# Patient Record
Sex: Female | Born: 1990 | Race: White | Hispanic: No | Marital: Single | Smoking: Current every day smoker
Health system: Southern US, Community
[De-identification: ages and names within clinical notes are randomized; demographics above are authoritative.]

## PROBLEM LIST (undated history)

## (undated) ENCOUNTER — Inpatient Hospital Stay (HOSPITAL_COMMUNITY): Payer: Self-pay

## (undated) DIAGNOSIS — A749 Chlamydial infection, unspecified: Secondary | ICD-10-CM

## (undated) DIAGNOSIS — N390 Urinary tract infection, site not specified: Secondary | ICD-10-CM

## (undated) DIAGNOSIS — Z8619 Personal history of other infectious and parasitic diseases: Secondary | ICD-10-CM

## (undated) DIAGNOSIS — Z8742 Personal history of other diseases of the female genital tract: Secondary | ICD-10-CM

## (undated) DIAGNOSIS — N83209 Unspecified ovarian cyst, unspecified side: Secondary | ICD-10-CM

## (undated) DIAGNOSIS — K824 Cholesterolosis of gallbladder: Secondary | ICD-10-CM

## (undated) HISTORY — PX: UPPER GI ENDOSCOPY: SHX6162

## (undated) HISTORY — DX: Personal history of other diseases of the female genital tract: Z87.42

## (undated) HISTORY — DX: Cholesterolosis of gallbladder: K82.4

## (undated) HISTORY — DX: Personal history of other infectious and parasitic diseases: Z86.19

## (undated) HISTORY — PX: WISDOM TOOTH EXTRACTION: SHX21

---
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
## 2010-03-23 ENCOUNTER — Ambulatory Visit (HOSPITAL_COMMUNITY): Admission: RE | Admit: 2010-03-23 | Discharge: 2010-03-23 | Payer: Self-pay | Admitting: Obstetrics and Gynecology

## 2010-12-25 ENCOUNTER — Emergency Department (HOSPITAL_COMMUNITY)
Admission: EM | Admit: 2010-12-25 | Discharge: 2010-12-25 | Payer: Self-pay | Source: Home / Self Care | Admitting: Emergency Medicine

## 2011-04-11 NOTE — Discharge Summary (Signed)
NAMEASLYN, COTTMAN NO.:  [id number]   MEDICAL RECORD NO.:  [id number]          PATIENT TYPE:  OBV   LOCATION:  6127                         FACILITY:  [facility]   PHYSICIAN:  Dyann Ruddle, MDDATE OF BIRTH:  February 21, 1991   DATE OF ADMISSION:  12/31/2007  DATE OF DISCHARGE:  01/01/2008                               DISCHARGE SUMMARY   REASON FOR HOSPITALIZATION:  A 20 year old female with bilateral  tuboovarian abscesses.   SIGNIFICANT FINDINGS:  A 20 year old female was admitted from her  primary care physician at [facility] for bilateral tuboovarian  abscess diagnosed on a pelvic ultrasound (transvaginal and abdominal).  She received ceftriaxone at her PMD.  At the PMD's office, urine GC and  chlamydia were both positive.  Initial bimanual exam demonstrated right  adnexal tenderness.  Ultrasound confirmed bilateral fluid collections in  the fallopian tubes.  Initial CBC demonstrated white count of 15.5 with  hemoglobin 13.5, hematocrit 40.3, platelets 356, 85% neutrophils.  During her hospitalization, she was able to tolerate p.o. medications  with minimal abdominal pain.  She was also afebrile during the admission  (had been afebrile throughout her illness).  She was discussed with Dr.  Ellyn Hack at [facility] who will follow her as an outpatient.  Dr.  Ellyn Hack agreed that she could be discharged on p.o. medications and will  follow up in 1 week in their clinic.  The team discussed the importance  of completing all medication doses completely with both the patient and  her mother who was aware of her condition.  We also discussed side  effects of the medication and the need to avoid alcohol as well as to  have her partner treated.  She has already told her partner about her  diagnosis and has recommended that he receive treatment.  Mother is also  understanding the importance of completion of the entire 14 day course  of antibiotics and will  help to assure that she completes these.   TREATMENT:  Doxycycline p.o. and Flagyl.   FINAL DIAGNOSIS:  Pelvic inflammatory disease with bilateral tuboovarian  abscess secondary to gonorrhea and chlamydia.   DISCHARGE MEDICATIONS:  1. Doxycycline 100 mg p.o. b.i.d. x 13 days.  2. Flagyl 500 mg p.o. b.i.d. x 13 days.   DISCHARGE INSTRUCTIONS:  1. She is to call or see an MD if she has worsening abdominal pain,      fevers or other concerns.  2. Pending results and issues to be followed include HIV, RPR, serum      beta hCG which were drawn prior to discharge.   FOLLOW UP:  She will follow up with Dr. Ellyn Hack at [facility] on  January 10, 2008 at 11 a.m.  (Phone [phone]).   Discharge weight is 71.4 kilos.   CONDITION ON DISCHARGE:  Improved.   This discharge summary is faxed to her primary care physician at  [facility] as well as to [facility].    Addendum:  At time of signing, HIV, RPR and beta hCG are all negative. --lsp      Pediatrics Resident  Dyann Ruddle, MD  Electronically Signed    PR/MEDQ  D:  01/01/2008  T:  01/02/2008  Job:  [id number]   cc:   Jay Schlichter, MD  Sherron Monday, MD

## 2011-08-18 LAB — DIFFERENTIAL
Basophils Absolute: 0.1
Eosinophils Absolute: 0
Eosinophils Relative: 0
Lymphocytes Relative: 10 — ABNORMAL LOW
Monocytes Absolute: 0.5
Neutrophils Relative %: 85 — ABNORMAL HIGH

## 2011-08-18 LAB — CBC
Hemoglobin: 13.5
Platelets: 356
RDW: 12.3
WBC: 15.5 — ABNORMAL HIGH

## 2011-08-18 LAB — HCG, QUANTITATIVE, PREGNANCY: hCG, Beta Chain, Quant, S: <2

## 2011-11-28 NOTE — L&D Delivery Note (Signed)
Delivery Note Pt progressed to complete and pushed well.  At 3:36 PM a viable female was delivered via Vaginal, Spontaneous Delivery (Presentation: Right Occiput Anterior).  APGAR: 8, 9; weight pending.   Placenta status: Intact, Spontaneous.  Cord: 3 vessels with the following complications: None.  Anesthesia: Epidural  Episiotomy: None Lacerations: Vaginal Suture Repair: 3.0 vicryl rapide Est. Blood Loss (mL): 400  Mom to postpartum.  Baby to stay with mom.  Bruno Leach D 10/28/2012, 3:52 PM

## 2012-05-31 ENCOUNTER — Inpatient Hospital Stay (HOSPITAL_COMMUNITY)
Admission: AD | Admit: 2012-05-31 | Discharge: 2012-05-31 | Disposition: A | Discharge status: Home / Self Care | Payer: Medicaid Other | Source: Ambulatory Visit | Attending: Obstetrics & Gynecology | Admitting: Obstetrics & Gynecology

## 2012-05-31 ENCOUNTER — Encounter (HOSPITAL_COMMUNITY): Payer: Self-pay | Admitting: *Deleted

## 2012-05-31 DIAGNOSIS — N949 Unspecified condition associated with female genital organs and menstrual cycle: Secondary | ICD-10-CM

## 2012-05-31 DIAGNOSIS — R42 Dizziness and giddiness: Secondary | ICD-10-CM

## 2012-05-31 DIAGNOSIS — O2650 Maternal hypotension syndrome, unspecified trimester: Secondary | ICD-10-CM | POA: Insufficient documentation

## 2012-05-31 DIAGNOSIS — R1032 Left lower quadrant pain: Secondary | ICD-10-CM | POA: Insufficient documentation

## 2012-05-31 LAB — URINE MICROSCOPIC-ADD ON

## 2012-05-31 LAB — URINALYSIS, ROUTINE W REFLEX MICROSCOPIC
Bilirubin Urine: NEGATIVE
Ketones, ur: 40 mg/dL — AB
Specific Gravity, Urine: 1.025 (ref 1.005–1.030)
Urobilinogen, UA: 0.2 mg/dL (ref 0.0–1.0)

## 2012-05-31 NOTE — MAU Provider Note (Signed)
  History     CSN: [id number]  Arrival date and time: 05/31/12 1836   None     Chief Complaint  Patient presents with  . Abdominal Pain   HPI This is a 21 year old G2 P0 010 at 18 weeks and 3 days presents the MAU with left lower quadrant abdominal pain that started 2 weeks ago and is worse when walking and moving. She denies vaginal bleeding, contractions, vaginal discharge. She also has occasional episodes of presyncope and feeling lightheaded and dizzy. These usually happened when she is standing for long periods of time. She denies chest pain, palpitations, shortness of breath.  OB History    Grav Para Term Preterm Abortions TAB SAB Ect Mult Living   2    1  1          Past Medical History  Diagnosis Date  . No pertinent past medical history     Past Surgical History  Procedure Date  . No past surgeries     Family History  Problem Relation Age of Onset  . Other Neg Hx     History  Substance Use Topics  . Smoking status: Current Everyday Smoker -- 0.2 packs/day    Types: Cigarettes  . Smokeless tobacco: Not on file  . Alcohol Use: No    Allergies: No Known Allergies  Prescriptions prior to admission  Medication Sig Dispense Refill  . Prenatal Vit-Fe Fumarate-FA (PRENATAL MULTIVITAMIN) TABS Take 1 tablet by mouth daily.        Review of Systems  All other systems reviewed and are negative.   Physical Exam   Blood pressure 114/63, pulse 82, temperature 98.9 F (37.2 C), temperature source Oral, resp. rate 20, height 5\' 9"  (1.753 m), weight 74.299 kg (163 lb 12.8 oz), last menstrual period 01/23/2012.  Physical Exam  Constitutional: She appears well-developed.  HENT:  Head: Normocephalic and atraumatic.  Cardiovascular: Normal rate and regular rhythm.   Respiratory: Effort normal.  GI: Soft. She exhibits no distension and no mass. There is tenderness (Mild left lower quadrant tenderness). There is no rebound and no guarding.       Fundal height  approximates dates.  Skin: Skin is warm and dry. No rash noted. No erythema. No pallor.  Psychiatric: She has a normal mood and affect. Her behavior is normal. Judgment and thought content normal.    MAU Course  Procedures   Assessment and Plan  #1 presyncope #2 round ligament pain #3 intrauterine pregnancy at 18 weeks and 3 days  Surgical patient to remain adequately hydrated and to avoid excessive periods of time in hot and humid conditions. Reassured patient regarding round ligament pain. The patient to obtain prenatal care with [facility]. Patient to followup with conditions worse.  Crystal Owens JEHIEL 05/31/2012, 8:59 PM

## 2012-05-31 NOTE — MAU Note (Signed)
I've been having sharp, cramping pains in lower abdomin for 3 days. At first the pain would go away but now it's not going away.

## 2012-06-25 LAB — OB RESULTS CONSOLE HEPATITIS B SURFACE ANTIGEN: Hepatitis B Surface Ag: NEGATIVE

## 2012-06-25 LAB — OB RESULTS CONSOLE RPR: RPR: NONREACTIVE

## 2012-06-25 LAB — OB RESULTS CONSOLE ABO/RH: RH Type: POSITIVE

## 2012-06-25 LAB — OB RESULTS CONSOLE ANTIBODY SCREEN: Antibody Screen: NEGATIVE

## 2012-06-25 LAB — OB RESULTS CONSOLE HIV ANTIBODY (ROUTINE TESTING): HIV: NONREACTIVE

## 2012-06-25 LAB — OB RESULTS CONSOLE GC/CHLAMYDIA: Gonorrhea: NEGATIVE

## 2012-08-08 ENCOUNTER — Encounter (HOSPITAL_COMMUNITY): Payer: Self-pay | Admitting: *Deleted

## 2012-08-08 ENCOUNTER — Encounter: Payer: Self-pay | Admitting: Advanced Practice Midwife

## 2012-08-08 ENCOUNTER — Inpatient Hospital Stay (HOSPITAL_COMMUNITY)
Admission: AD | Admit: 2012-08-08 | Discharge: 2012-08-08 | Disposition: A | Discharge status: Home / Self Care | Payer: Medicaid Other | Source: Ambulatory Visit | Attending: Obstetrics and Gynecology | Admitting: Obstetrics and Gynecology

## 2012-08-08 DIAGNOSIS — R109 Unspecified abdominal pain: Secondary | ICD-10-CM | POA: Insufficient documentation

## 2012-08-08 DIAGNOSIS — N390 Urinary tract infection, site not specified: Secondary | ICD-10-CM

## 2012-08-08 DIAGNOSIS — N12 Tubulo-interstitial nephritis, not specified as acute or chronic: Secondary | ICD-10-CM | POA: Insufficient documentation

## 2012-08-08 DIAGNOSIS — O2390 Unspecified genitourinary tract infection in pregnancy, unspecified trimester: Secondary | ICD-10-CM | POA: Insufficient documentation

## 2012-08-08 DIAGNOSIS — O2300 Infections of kidney in pregnancy, unspecified trimester: Secondary | ICD-10-CM

## 2012-08-08 DIAGNOSIS — M549 Dorsalgia, unspecified: Secondary | ICD-10-CM | POA: Insufficient documentation

## 2012-08-08 HISTORY — DX: Chlamydial infection, unspecified: A74.9

## 2012-08-08 HISTORY — DX: Unspecified ovarian cyst, unspecified side: N83.209

## 2012-08-08 HISTORY — DX: Urinary tract infection, site not specified: N39.0

## 2012-08-08 LAB — URINALYSIS, ROUTINE W REFLEX MICROSCOPIC
Nitrite: NEGATIVE
Specific Gravity, Urine: 1.01 (ref 1.005–1.030)
Urobilinogen, UA: 0.2 mg/dL (ref 0.0–1.0)

## 2012-08-08 LAB — URINE MICROSCOPIC-ADD ON

## 2012-08-08 MED ORDER — DEXTROSE 5 % IV SOLN
2.0000 g | Freq: Once | INTRAVENOUS | Status: AC
  Administered 2012-08-08: 2 g via INTRAVENOUS
  Filled 2012-08-08: qty 2

## 2012-08-08 MED ORDER — SODIUM CHLORIDE 0.9 % IV SOLN
INTRAVENOUS | Status: DC
  Administered 2012-08-08: 18:00:00 via INTRAVENOUS

## 2012-08-08 MED ORDER — SULFAMETHOXAZOLE-TRIMETHOPRIM 800-160 MG PO TABS: 1.0000 | ORAL_TABLET | Freq: Two times a day (BID) | ORAL | Status: AC

## 2012-08-08 NOTE — MAU Note (Signed)
Real bad pain in mid back, started Sun night.  Continues and has gotten worse.

## 2012-08-08 NOTE — MAU Provider Note (Signed)
History     CSN: [id number]  Arrival date and time: 08/08/12 1541   First Provider Initiated Contact with Patient 08/08/12 1653      Chief Complaint  Patient presents with  . Back Pain   HPI Crystal Owens is 21 y.o. G2P0010 [redacted]w[redacted]d weeks presenting with back ache X 4 days.   Patient of Dr. Emeline Darling, last seen in office 9/3.   States she has chronic back pain.  The pain has been intermittent but usually the whole back but now it is only the right back.  Denies UTI sxs. Denies fever and chills.   Last intercourse last week.      Past Medical History  Diagnosis Date  . No pertinent past medical history   . Urinary tract infection   . Ovarian cyst   . Gonorrhea   . Chlamydia     Past Surgical History  Procedure Date  . No past surgeries     Family History  Problem Relation Age of Onset  . Other Neg Hx   . Hearing loss Neg Hx   . Hypertension Mother   . Cancer Mother 48    lung  . Cancer Maternal Uncle     lung  . Heart disease Maternal Grandfather   . Heart disease Paternal Grandfather     grandfather, great grandfather    History  Substance Use Topics  . Smoking status: Current Every Day Smoker -- 0.2 packs/day for 7 years    Types: Cigarettes  . Smokeless tobacco: Never Used  . Alcohol Use: No    Allergies: No Known Allergies  Prescriptions prior to admission  Medication Sig Dispense Refill  . Prenatal Vit-Fe Fumarate-FA (PRENATAL MULTIVITAMIN) TABS Take 1 tablet by mouth daily.        Review of Systems  Constitutional: Negative for fever and chills.  Genitourinary: Positive for flank pain. Negative for dysuria, urgency, frequency and hematuria.       Neg for vaginal bleeding  Musculoskeletal: Positive for back pain.  Neurological: Negative for weakness.   Physical Exam   Blood pressure 112/68, pulse 104, temperature 99.2 F (37.3 C), temperature source Oral, resp. rate 20, height 5' 9.5" (1.765 m), weight 79.652 kg (175 lb 9.6 oz), last menstrual  period 01/23/2012.  Physical Exam  Vitals reviewed. Constitutional: She is oriented to person, place, and time. She appears well-developed and well-nourished. No distress.       uncomfortable  Respiratory: Effort normal.  GI: Soft. There is no tenderness. There is CVA tenderness (right).  Neurological: She is alert and oriented to person, place, and time.  Skin: Skin is warm and dry.  Psychiatric: She has a normal mood and affect. Her behavior is normal.   Results for orders placed during the hospital encounter of 08/08/12 (from the past 24 hour(s))  URINALYSIS, ROUTINE W REFLEX MICROSCOPIC     Status: Abnormal   Collection Time   08/08/12  4:05 PM      Component Value Range   Color, Urine YELLOW  YELLOW   APPearance CLEAR  CLEAR   Specific Gravity, Urine 1.010  1.005 - 1.030   pH 6.5  5.0 - 8.0   Glucose, UA NEGATIVE  NEGATIVE mg/dL   Hgb urine dipstick MODERATE (*) NEGATIVE   Bilirubin Urine NEGATIVE  NEGATIVE   Ketones, ur NEGATIVE  NEGATIVE mg/dL   Protein, ur 30 (*) NEGATIVE mg/dL   Urobilinogen, UA 0.2  0.0 - 1.0 mg/dL   Nitrite NEGATIVE  NEGATIVE  Leukocytes, UA MODERATE (*) NEGATIVE  URINE MICROSCOPIC-ADD ON     Status: Abnormal   Collection Time   08/08/12  4:05 PM      Component Value Range   Squamous Epithelial / LPF RARE  RARE   WBC, UA 21-50  <3 WBC/hpf   RBC / HPF 11-20  <3 RBC/hpf   Bacteria, UA MANY (*) RARE   Urine-Other MUCOUS PRESENT     MAU Course  Procedures  MDM Urine culture sent to lab 17:10  Reported MSE to Dr. Senaida Ores.  Order given for Rocephin 2 gm and home with Rx for Bactrim DS 1 tab bid X 1 week.  Keep appt for 9/18.   18:10  Rocephin 2gm IV infused.  Patient ready for discharge.  FMS--FHR 150s, mod variability, 10X10s at time of discharge  Assessment and Plan  A:  Right flank pain at [redacted]w[redacted]d gestation       Presumptive right pyelonephritis  P  Rocephin 2 gms IV given in MAU     Rx for Bactrim DS 1 po BID X 1 week     Keep scheduled  appointment for 08/14/12     Instructions to call her doctor of sxs worsen, fever or chills  KEY,EVE M 08/08/2012, 4:58 PM

## 2012-08-08 NOTE — MAU Note (Signed)
Rt CVA tenderness. 

## 2012-08-10 LAB — URINE CULTURE: Colony Count: 30000

## 2012-09-03 LAB — OB RESULTS CONSOLE GC/CHLAMYDIA: Chlamydia: NEGATIVE

## 2012-10-22 ENCOUNTER — Encounter (HOSPITAL_COMMUNITY): Payer: Self-pay | Admitting: *Deleted

## 2012-10-22 ENCOUNTER — Telehealth (HOSPITAL_COMMUNITY): Payer: Self-pay | Admitting: *Deleted

## 2012-10-22 NOTE — Telephone Encounter (Signed)
Preadmission screen  

## 2012-10-28 ENCOUNTER — Encounter (HOSPITAL_COMMUNITY): Payer: Self-pay | Admitting: Anesthesiology

## 2012-10-28 ENCOUNTER — Inpatient Hospital Stay (HOSPITAL_COMMUNITY)
Admission: AD | Admit: 2012-10-28 | Discharge: 2012-10-30 | DRG: 775 | Disposition: A | Discharge status: Home / Self Care | Payer: Medicaid Other | Source: Ambulatory Visit | Attending: Obstetrics and Gynecology | Admitting: Obstetrics and Gynecology

## 2012-10-28 ENCOUNTER — Encounter (HOSPITAL_COMMUNITY): Payer: Self-pay | Admitting: *Deleted

## 2012-10-28 ENCOUNTER — Inpatient Hospital Stay (HOSPITAL_COMMUNITY): Payer: Medicaid Other | Admitting: Anesthesiology

## 2012-10-28 DIAGNOSIS — O99892 Other specified diseases and conditions complicating childbirth: Secondary | ICD-10-CM | POA: Diagnosis present

## 2012-10-28 DIAGNOSIS — Z22330 Carrier of Group B streptococcus: Secondary | ICD-10-CM

## 2012-10-28 LAB — CBC
HCT: 30.8 % — ABNORMAL LOW (ref 36.0–46.0)
MCHC: 34.7 g/dL (ref 30.0–36.0)
Platelets: 146 10*3/uL — ABNORMAL LOW (ref 150–400)
RDW: 13.4 % (ref 11.5–15.5)
WBC: 12.3 10*3/uL — ABNORMAL HIGH (ref 4.0–10.5)

## 2012-10-28 LAB — RPR: RPR Ser Ql: NONREACTIVE

## 2012-10-28 MED ORDER — ONDANSETRON HCL 4 MG PO TABS: 4.0000 mg | ORAL_TABLET | ORAL | Status: DC | PRN

## 2012-10-28 MED ORDER — ONDANSETRON HCL 4 MG/2ML IJ SOLN: 4.0000 mg | INTRAMUSCULAR | Status: DC | PRN

## 2012-10-28 MED ORDER — WITCH HAZEL-GLYCERIN EX PADS: 1.0000 "application " | MEDICATED_PAD | CUTANEOUS | Status: DC | PRN

## 2012-10-28 MED ORDER — SODIUM BICARBONATE 8.4 % IV SOLN
INTRAVENOUS | Status: DC | PRN
  Administered 2012-10-28: 5 mL via EPIDURAL

## 2012-10-28 MED ORDER — DIPHENHYDRAMINE HCL 50 MG/ML IJ SOLN: 12.5000 mg | INTRAMUSCULAR | Status: DC | PRN

## 2012-10-28 MED ORDER — OXYCODONE-ACETAMINOPHEN 5-325 MG PO TABS
1.0000 | ORAL_TABLET | ORAL | Status: DC | PRN
  Administered 2012-10-28 – 2012-10-30 (×5): 1 via ORAL
  Filled 2012-10-28 (×4): qty 1

## 2012-10-28 MED ORDER — ACETAMINOPHEN 325 MG PO TABS: 650.0000 mg | ORAL_TABLET | ORAL | Status: DC | PRN

## 2012-10-28 MED ORDER — IBUPROFEN 600 MG PO TABS: 600.0000 mg | ORAL_TABLET | Freq: Four times a day (QID) | ORAL | Status: DC | PRN

## 2012-10-28 MED ORDER — PRENATAL MULTIVITAMIN CH
1.0000 | ORAL_TABLET | Freq: Every day | ORAL | Status: DC
  Administered 2012-10-29 – 2012-10-30 (×2): 1 via ORAL
  Filled 2012-10-28 (×2): qty 1

## 2012-10-28 MED ORDER — ONDANSETRON HCL 4 MG/2ML IJ SOLN: 4.0000 mg | Freq: Four times a day (QID) | INTRAMUSCULAR | Status: DC | PRN

## 2012-10-28 MED ORDER — LACTATED RINGERS IV SOLN: 500.0000 mL | INTRAVENOUS | Status: DC | PRN

## 2012-10-28 MED ORDER — FENTANYL 2.5 MCG/ML BUPIVACAINE 1/10 % EPIDURAL INFUSION (WH - ANES)
14.0000 mL/h | INTRAMUSCULAR | Status: DC
  Filled 2012-10-28: qty 125

## 2012-10-28 MED ORDER — METHYLERGONOVINE MALEATE 0.2 MG/ML IJ SOLN: 0.2000 mg | INTRAMUSCULAR | Status: DC | PRN

## 2012-10-28 MED ORDER — DIPHENHYDRAMINE HCL 25 MG PO CAPS: 25.0000 mg | ORAL_CAPSULE | Freq: Four times a day (QID) | ORAL | Status: DC | PRN

## 2012-10-28 MED ORDER — EPHEDRINE 5 MG/ML INJ: 10.0000 mg | INTRAVENOUS | Status: DC | PRN

## 2012-10-28 MED ORDER — OXYCODONE-ACETAMINOPHEN 5-325 MG PO TABS: 1.0000 | ORAL_TABLET | ORAL | Status: DC | PRN

## 2012-10-28 MED ORDER — BENZOCAINE-MENTHOL 20-0.5 % EX AERO: 1.0000 "application " | INHALATION_SPRAY | CUTANEOUS | Status: DC | PRN

## 2012-10-28 MED ORDER — LACTATED RINGERS IV SOLN: 500.0000 mL | Freq: Once | INTRAVENOUS | Status: DC

## 2012-10-28 MED ORDER — FENTANYL 2.5 MCG/ML BUPIVACAINE 1/10 % EPIDURAL INFUSION (WH - ANES)
INTRAMUSCULAR | Status: DC | PRN
  Administered 2012-10-28: 14 mL/h via EPIDURAL

## 2012-10-28 MED ORDER — LANOLIN HYDROUS EX OINT: TOPICAL_OINTMENT | CUTANEOUS | Status: DC | PRN

## 2012-10-28 MED ORDER — DIBUCAINE 1 % RE OINT: 1.0000 "application " | TOPICAL_OINTMENT | RECTAL | Status: DC | PRN

## 2012-10-28 MED ORDER — PENICILLIN G POTASSIUM 5000000 UNITS IJ SOLR
2.5000 10*6.[IU] | INTRAVENOUS | Status: DC
  Administered 2012-10-28: 2.5 10*6.[IU] via INTRAVENOUS
  Filled 2012-10-28 (×5): qty 2.5

## 2012-10-28 MED ORDER — SIMETHICONE 80 MG PO CHEW: 80.0000 mg | CHEWABLE_TABLET | ORAL | Status: DC | PRN

## 2012-10-28 MED ORDER — PENICILLIN G POTASSIUM 5000000 UNITS IJ SOLR
5.0000 10*6.[IU] | Freq: Once | INTRAVENOUS | Status: AC
  Administered 2012-10-28: 5 10*6.[IU] via INTRAVENOUS
  Filled 2012-10-28: qty 5

## 2012-10-28 MED ORDER — OXYTOCIN 40 UNITS IN LACTATED RINGERS INFUSION - SIMPLE MED
1.0000 m[IU]/min | INTRAVENOUS | Status: DC
  Administered 2012-10-28: 2 m[IU]/min via INTRAVENOUS
  Filled 2012-10-28: qty 1000

## 2012-10-28 MED ORDER — OXYTOCIN BOLUS FROM INFUSION: 500.0000 mL | INTRAVENOUS | Status: DC

## 2012-10-28 MED ORDER — CITRIC ACID-SODIUM CITRATE 334-500 MG/5ML PO SOLN: 30.0000 mL | ORAL | Status: DC | PRN

## 2012-10-28 MED ORDER — MAGNESIUM HYDROXIDE 400 MG/5ML PO SUSP: 30.0000 mL | ORAL | Status: DC | PRN

## 2012-10-28 MED ORDER — IBUPROFEN 600 MG PO TABS
600.0000 mg | ORAL_TABLET | Freq: Four times a day (QID) | ORAL | Status: DC
  Administered 2012-10-28 – 2012-10-30 (×8): 600 mg via ORAL
  Filled 2012-10-28 (×8): qty 1

## 2012-10-28 MED ORDER — OXYTOCIN 40 UNITS IN LACTATED RINGERS INFUSION - SIMPLE MED: 62.5000 mL/h | INTRAVENOUS | Status: DC

## 2012-10-28 MED ORDER — LACTATED RINGERS IV SOLN
INTRAVENOUS | Status: DC
  Administered 2012-10-28 (×2): via INTRAVENOUS

## 2012-10-28 MED ORDER — MEASLES, MUMPS & RUBELLA VAC ~~LOC~~ INJ
0.5000 mL | INJECTION | Freq: Once | SUBCUTANEOUS | Status: AC
  Administered 2012-10-30: 0.5 mL via SUBCUTANEOUS
  Filled 2012-10-28: qty 0.5

## 2012-10-28 MED ORDER — BUTORPHANOL TARTRATE 1 MG/ML IJ SOLN
2.0000 mg | Freq: Once | INTRAMUSCULAR | Status: AC
  Administered 2012-10-28: 2 mg via INTRAVENOUS
  Filled 2012-10-28 (×2): qty 1

## 2012-10-28 MED ORDER — TETANUS-DIPHTH-ACELL PERTUSSIS 5-2.5-18.5 LF-MCG/0.5 IM SUSP: 0.5000 mL | Freq: Once | INTRAMUSCULAR | Status: DC

## 2012-10-28 MED ORDER — EPHEDRINE 5 MG/ML INJ
10.0000 mg | INTRAVENOUS | Status: DC | PRN
  Filled 2012-10-28: qty 4

## 2012-10-28 MED ORDER — LIDOCAINE HCL (PF) 1 % IJ SOLN: 30.0000 mL | INTRAMUSCULAR | Status: DC | PRN

## 2012-10-28 MED ORDER — PHENYLEPHRINE 40 MCG/ML (10ML) SYRINGE FOR IV PUSH (FOR BLOOD PRESSURE SUPPORT)
80.0000 ug | PREFILLED_SYRINGE | INTRAVENOUS | Status: DC | PRN
  Filled 2012-10-28: qty 5

## 2012-10-28 MED ORDER — SENNOSIDES-DOCUSATE SODIUM 8.6-50 MG PO TABS
2.0000 | ORAL_TABLET | Freq: Every day | ORAL | Status: DC
  Administered 2012-10-28: 2 via ORAL

## 2012-10-28 MED ORDER — METHYLERGONOVINE MALEATE 0.2 MG PO TABS: 0.2000 mg | ORAL_TABLET | ORAL | Status: DC | PRN

## 2012-10-28 MED ORDER — PHENYLEPHRINE 40 MCG/ML (10ML) SYRINGE FOR IV PUSH (FOR BLOOD PRESSURE SUPPORT): 80.0000 ug | PREFILLED_SYRINGE | INTRAVENOUS | Status: DC | PRN

## 2012-10-28 MED ORDER — TERBUTALINE SULFATE 1 MG/ML IJ SOLN: 0.2500 mg | Freq: Once | INTRAMUSCULAR | Status: DC | PRN

## 2012-10-28 MED ORDER — LACTATED RINGERS IV BOLUS (SEPSIS)
1000.0000 mL | Freq: Once | INTRAVENOUS | Status: AC
  Administered 2012-10-28: 1000 mL via INTRAVENOUS

## 2012-10-28 MED ORDER — ZOLPIDEM TARTRATE 5 MG PO TABS: 5.0000 mg | ORAL_TABLET | Freq: Every evening | ORAL | Status: DC | PRN

## 2012-10-28 NOTE — H&P (Signed)
Crystal Owens is a 21 y.o. female G2P0010 at 70+ with painful contractions and bloody show.  Pt states contractions every 5 minutes since 1 am.  Cervical change from office 1cm to 2.5cm.  Also with occasional lates in MAU. Prenatal care uncomplicated except late entry, dated by LMP cw [place] at 21+ week and Rubella equivocal.  . Maternal Medical History:  Reason for admission: Reason for admission: contractions and vaginal bleeding.  Contractions: Onset was 6-12 hours ago.   Frequency: regular.   Perceived severity is strong.    Fetal activity: Perceived fetal activity is normal.   Last perceived fetal movement was within the past hour.      OB History    Grav Para Term Preterm Abortions TAB SAB Ect Mult Living   2    1  1        G1 missed Ab - 6wks, cytotec G2 present No pap, h/o GC/Chl - PID (hospitalized in 2009), Chl in pregnancy  Past Medical History  Diagnosis Date  . No pertinent past medical history   . Urinary tract infection   . Ovarian cyst   . Gonorrhea   . Chlamydia   . Hx of gonorrhea   . History of acute PID     had bilateral tubovarian abcesses sec to gonorrhea and chlamydia   Past Surgical History  Procedure Date  . No past surgeries    Family History: family history includes Bipolar disorder in her mother and sister; Cancer in her maternal grandfather and maternal uncle; Cancer (age of onset:48) in her mother; Diabetes in her maternal grandfather; Heart disease in her maternal grandfather and paternal grandfather; Hypertension in her maternal grandfather and mother; and Polydactyly in her sister.  There is no history of Other and Hearing loss. Social History:  reports that she has been smoking Cigarettes.  She has a 1.75 pack-year smoking history. She has never used smokeless tobacco. She reports that she does not drink alcohol or use illicit drugs. <1/2 ppd tob, h/o MJ use, none in pregnancy.  Single  Meds PNV All NKDA   Prenatal Transfer Tool  Maternal  Diabetes: No Genetic Screening: Declined - late to care Maternal Ultrasounds/Referrals: Normal Fetal Ultrasounds or other Referrals:  None Maternal Substance Abuse:  Yes:  Type: Smoker <1/2 ppd Significant Maternal Medications:  None Significant Maternal Lab Results:  Lab values include: Group B Strep positive Other Comments:  late to care - 20+ week  Review of Systems  Constitutional: Negative.   HENT: Negative.   Eyes: Negative.   Respiratory: Negative.   Cardiovascular: Negative.   Gastrointestinal: Negative.   Genitourinary: Negative.   Musculoskeletal: Negative.   Skin: Negative.   Neurological: Negative.   Psychiatric/Behavioral: Negative.     Dilation: 2.5 Effacement (%): 70 Station: -3 Exam by:: DHarris RN Blood pressure 128/64, pulse 102, temperature 98.1 F (36.7 C), temperature source Oral, resp. rate 20, height 5\' 10"  (1.778 m), weight 93.441 kg (206 lb), last menstrual period 01/23/2012, SpO2 99.00%. Maternal Exam:  Uterine Assessment: Contraction strength is moderate.  Contraction frequency is regular.   Abdomen: Fundal height is appropriate for gestation.   Estimated fetal weight is 8#.   Fetal presentation: vertex  Pelvis: adequate for delivery.   Cervix: Cervix evaluated by digital exam.     Physical Exam  Constitutional: She is oriented to person, place, and time. She appears well-developed and well-nourished.  HENT:  Head: Normocephalic.  Eyes: Conjunctivae normal are normal. Pupils are equal, round, and  reactive to light.  Neck: Normal range of motion. Neck supple.  Cardiovascular: Normal rate and regular rhythm.   Respiratory: Effort normal and breath sounds normal. No respiratory distress.  GI: Soft. Bowel sounds are normal. There is no tenderness.  Musculoskeletal: Normal range of motion.  Neurological: She is alert and oriented to person, place, and time.  Skin: Skin is warm and dry.  Psychiatric: She has a normal mood and affect. Her  behavior is normal.    Prenatal labs: ABO, Rh: O/Positive/-- (07/30 0000) Antibody: Negative (07/30 0000) Rubella: Equivocal (07/30 0000) RPR: Nonreactive (07/30 0000)  HBsAg: Negative (07/30 0000)  HIV: Non-reactive (07/30 0000)  GBS: Positive (10/30 0000)  Hgb 11.8/ Ur Cx neg/ Plt 185K/GC neg/Chl + - TOC neg/ CF neg/ too late to care for screening/ glucola 61  Flu 10/8 Tdap 9/2  U/S EDC 12/2  cwd - nl anat, ant plac, female Assessment/Plan: 21yo G2P0010 at 40+ Admit for augmentation of labor Epidural for comfort gbbs + - PCN for prophylaxis MMR PP - Rubella equivocal   BOVARD,Menelik Mcfarren 10/28/2012, 5:56 AM

## 2012-10-28 NOTE — MAU Note (Signed)
Contractions, spotting 

## 2012-10-28 NOTE — Progress Notes (Signed)
Feeling ctx Does not appear uncomfortable Afeb, VSS FHT- Cat I, irreg ctx VE- 4/70/-1, vtx, AROM light meconium Will monitor progress, start pitocin augmentation, continue PCN for +GBS

## 2012-10-28 NOTE — MAU Note (Signed)
Pt. Has been having uc's since 0100 they started out at 10 minutes apart and are now 5-7 minutes apart

## 2012-10-28 NOTE — Progress Notes (Signed)
Comfortable with epidural Afeb, VSS FHT- Cat II, occ variable decel, ctx q 2-3 min VE- 7/90/0, vtx Continue pitocin and PCN, monitor progress, anticipate SVD

## 2012-10-28 NOTE — Anesthesia Preprocedure Evaluation (Signed)

## 2012-10-28 NOTE — Anesthesia Procedure Notes (Signed)

## 2012-10-29 NOTE — Anesthesia Postprocedure Evaluation (Signed)
  Anesthesia Post-op Note  Patient: Crystal Owens  Procedure(s) Performed: * No procedures listed *  Patient Location: Mother/Baby  Anesthesia Type:Epidural  Level of Consciousness: awake, alert  and oriented  Airway and Oxygen Therapy: Patient Spontanous Breathing  Post-op Pain: mild  Post-op Assessment: Patient's Cardiovascular Status Stable, Respiratory Function Stable, No headache, No backache, No residual numbness and No residual motor weakness  Post-op Vital Signs: stable  Complications: No apparent anesthesia complications

## 2012-10-29 NOTE — Progress Notes (Signed)
PPD #1 No problems Afeb, VSS Fundus firm, NT at U-1 Continue routine postpartum care 

## 2012-10-30 MED ORDER — IBUPROFEN 600 MG PO TABS: 600.0000 mg | ORAL_TABLET | Freq: Four times a day (QID) | ORAL | Status: DC

## 2012-10-30 MED ORDER — OXYCODONE-ACETAMINOPHEN 5-325 MG PO TABS: 1.0000 | ORAL_TABLET | ORAL | Status: DC | PRN

## 2012-10-30 NOTE — Progress Notes (Signed)
PPD #2 No problems Afeb, VSS D/c home 

## 2012-10-30 NOTE — Discharge Summary (Signed)
Obstetric Discharge Summary Reason for Admission: onset of labor Prenatal Procedures: none Intrapartum Procedures: spontaneous vaginal delivery Postpartum Procedures: none Complications-Operative and Postpartum: left vaginal laceration Hemoglobin  Date Value Range Status  10/28/2012 10.7* 12.0 - 15.0 g/dL Final     HCT  Date Value Range Status  10/28/2012 30.8* 36.0 - 46.0 % Final    Physical Exam:  General: alert Lochia: appropriate Uterine Fundus: firm   Discharge Diagnoses: Term Pregnancy-delivered  Discharge Information: Date: 10/30/2012 Activity: pelvic rest Diet: routine Medications: Ibuprofen and Percocet Condition: stable Instructions: refer to practice specific booklet Discharge to: home Follow-up Information    Follow up with Zaydenn Balaguer D, MD. Schedule an appointment as soon as possible for a visit in 6 weeks.   Contact information:   [address] [place] [address] [phone]          Newborn Data: Live born female  Birth Weight: 7 lb 8.1 oz (3405 g) APGAR: 8, 9  Home with mother.  Crystal Owens 10/30/2012, 7:55 AM

## 2014-04-02 ENCOUNTER — Emergency Department (HOSPITAL_COMMUNITY)
Admission: EM | Admit: 2014-04-02 | Discharge: 2014-04-02 | Disposition: A | Discharge status: Home / Self Care | Payer: Medicaid Other | Attending: Emergency Medicine | Admitting: Emergency Medicine

## 2014-04-02 ENCOUNTER — Encounter (HOSPITAL_COMMUNITY): Payer: Self-pay | Admitting: Emergency Medicine

## 2014-04-02 DIAGNOSIS — R112 Nausea with vomiting, unspecified: Secondary | ICD-10-CM | POA: Insufficient documentation

## 2014-04-02 DIAGNOSIS — N390 Urinary tract infection, site not specified: Secondary | ICD-10-CM

## 2014-04-02 DIAGNOSIS — Z8619 Personal history of other infectious and parasitic diseases: Secondary | ICD-10-CM | POA: Insufficient documentation

## 2014-04-02 DIAGNOSIS — F17200 Nicotine dependence, unspecified, uncomplicated: Secondary | ICD-10-CM | POA: Insufficient documentation

## 2014-04-02 DIAGNOSIS — Z8742 Personal history of other diseases of the female genital tract: Secondary | ICD-10-CM | POA: Insufficient documentation

## 2014-04-02 DIAGNOSIS — Z3202 Encounter for pregnancy test, result negative: Secondary | ICD-10-CM | POA: Insufficient documentation

## 2014-04-02 LAB — CBC WITH DIFFERENTIAL/PLATELET
Basophils Absolute: 0 10*3/uL (ref 0.0–0.1)
Basophils Relative: 0 % (ref 0–1)
Eosinophils Absolute: 0 10*3/uL (ref 0.0–0.7)
Eosinophils Relative: 1 % (ref 0–5)
HCT: 40.3 % (ref 36.0–46.0)
Hemoglobin: 13.8 g/dL (ref 12.0–15.0)
LYMPHS ABS: 1.7 10*3/uL (ref 0.7–4.0)
LYMPHS PCT: 20 % (ref 12–46)
MCH: 30.5 pg (ref 26.0–34.0)
MCHC: 34.2 g/dL (ref 30.0–36.0)
MCV: 89.2 fL (ref 78.0–100.0)
Monocytes Absolute: 0.5 10*3/uL (ref 0.1–1.0)
Monocytes Relative: 6 % (ref 3–12)
NEUTROS ABS: 6.2 10*3/uL (ref 1.7–7.7)
NEUTROS PCT: 73 % (ref 43–77)
PLATELETS: 194 10*3/uL (ref 150–400)
RBC: 4.52 MIL/uL (ref 3.87–5.11)
RDW: 12.5 % (ref 11.5–15.5)
WBC: 8.5 10*3/uL (ref 4.0–10.5)

## 2014-04-02 LAB — COMPREHENSIVE METABOLIC PANEL
ALT: 7 U/L (ref 0–35)
AST: 11 U/L (ref 0–37)
Albumin: 4 g/dL (ref 3.5–5.2)
Alkaline Phosphatase: 60 U/L (ref 39–117)
BUN: 7 mg/dL (ref 6–23)
CHLORIDE: 105 meq/L (ref 96–112)
CO2: 21 meq/L (ref 19–32)
Calcium: 8.8 mg/dL (ref 8.4–10.5)
Creatinine, Ser: 0.62 mg/dL (ref 0.50–1.10)
GLUCOSE: 95 mg/dL (ref 70–99)
Potassium: 4.1 mEq/L (ref 3.7–5.3)
SODIUM: 140 meq/L (ref 137–147)
Total Bilirubin: 0.6 mg/dL (ref 0.3–1.2)
Total Protein: 6.9 g/dL (ref 6.0–8.3)

## 2014-04-02 LAB — URINALYSIS, ROUTINE W REFLEX MICROSCOPIC
Bilirubin Urine: NEGATIVE
GLUCOSE, UA: NEGATIVE mg/dL
HGB URINE DIPSTICK: NEGATIVE
Ketones, ur: 40 mg/dL — AB
Nitrite: NEGATIVE
PROTEIN: NEGATIVE mg/dL
Specific Gravity, Urine: 1.019 (ref 1.005–1.030)
Urobilinogen, UA: 1 mg/dL (ref 0.0–1.0)
pH: 7 (ref 5.0–8.0)

## 2014-04-02 LAB — URINE MICROSCOPIC-ADD ON

## 2014-04-02 LAB — LIPASE, BLOOD: Lipase: 11 U/L (ref 11–59)

## 2014-04-02 LAB — POC URINE PREG, ED: Preg Test, Ur: NEGATIVE

## 2014-04-02 MED ORDER — SULFAMETHOXAZOLE-TMP DS 800-160 MG PO TABS: 1.0000 | ORAL_TABLET | Freq: Two times a day (BID) | ORAL | Status: DC

## 2014-04-02 MED ORDER — ONDANSETRON 8 MG PO TBDP: 8.0000 mg | ORAL_TABLET | Freq: Three times a day (TID) | ORAL | Status: DC | PRN

## 2014-04-02 MED ORDER — ONDANSETRON HCL 4 MG/2ML IJ SOLN
4.0000 mg | Freq: Once | INTRAMUSCULAR | Status: AC
  Administered 2014-04-02: 4 mg via INTRAVENOUS
  Filled 2014-04-02: qty 2

## 2014-04-02 MED ORDER — FENTANYL CITRATE 0.05 MG/ML IJ SOLN
50.0000 ug | Freq: Once | INTRAMUSCULAR | Status: AC
  Administered 2014-04-02: 50 ug via INTRAVENOUS
  Filled 2014-04-02: qty 2

## 2014-04-02 MED ORDER — PROMETHAZINE HCL 25 MG PO TABS: 25.0000 mg | ORAL_TABLET | Freq: Four times a day (QID) | ORAL | Status: DC | PRN

## 2014-04-02 NOTE — Discharge Instructions (Signed)
Urinary Tract Infection  Urinary tract infections (UTIs) can develop anywhere along your urinary tract. Your urinary tract is your body's drainage system for removing wastes and extra water. Your urinary tract includes two kidneys, two ureters, a bladder, and a urethra. Your kidneys are a pair of bean-shaped organs. Each kidney is about the size of your fist. They are located below your ribs, one on each side of your spine.  CAUSES  Infections are caused by microbes, which are microscopic organisms, including fungi, viruses, and bacteria. These organisms are so small that they can only be seen through a microscope. Bacteria are the microbes that most commonly cause UTIs.  SYMPTOMS   Symptoms of UTIs may vary by age and gender of the patient and by the location of the infection. Symptoms in young women typically include a frequent and intense urge to urinate and a painful, burning feeling in the bladder or urethra during urination. Older women and men are more likely to be tired, shaky, and weak and have muscle aches and abdominal pain. A fever may mean the infection is in your kidneys. Other symptoms of a kidney infection include pain in your back or sides below the ribs, nausea, and vomiting.  DIAGNOSIS  To diagnose a UTI, your caregiver will ask you about your symptoms. Your caregiver also will ask to provide a urine sample. The urine sample will be tested for bacteria and white blood cells. White blood cells are made by your body to help fight infection.  TREATMENT   Typically, UTIs can be treated with medication. Because most UTIs are caused by a bacterial infection, they usually can be treated with the use of antibiotics. The choice of antibiotic and length of treatment depend on your symptoms and the type of bacteria causing your infection.  HOME CARE INSTRUCTIONS   If you were prescribed antibiotics, take them exactly as your caregiver instructs you. Finish the medication even if you feel better after you  have only taken some of the medication.   Drink enough water and fluids to keep your urine clear or pale yellow.   Avoid caffeine, tea, and carbonated beverages. They tend to irritate your bladder.   Empty your bladder often. Avoid holding urine for long periods of time.   Empty your bladder before and after sexual intercourse.   After a bowel movement, women should cleanse from front to back. Use each tissue only once.  SEEK MEDICAL CARE IF:    You have back pain.   You develop a fever.   Your symptoms do not begin to resolve within 3 days.  SEEK IMMEDIATE MEDICAL CARE IF:    You have severe back pain or lower abdominal pain.   You develop chills.   You have nausea or vomiting.   You have continued burning or discomfort with urination.  MAKE SURE YOU:    Understand these instructions.   Will watch your condition.   Will get help right away if you are not doing well or get worse.  Document Released: 08/23/2005 Document Revised: 05/14/2012 Document Reviewed: 12/22/2011  [employer] Patient Information 2014 [employer].

## 2014-04-02 NOTE — ED Notes (Signed)
Back pain going on for week or so then pt sates this morning woke up with abd pain and tried to use bathroom had small stool and been vomiting twice today. Pt states she hasnt been able to eat over the past couple days bc she "hasnt felt good".

## 2014-04-02 NOTE — ED Provider Notes (Signed)
CSN: [id number]     Arrival date & time 04/02/14  1156 History   First MD Initiated Contact with Patient 04/02/14 1215     Chief Complaint  Patient presents with  . back pain   . Abdominal Pain  . Emesis     (Consider location/radiation/quality/duration/timing/severity/associated sxs/prior Treatment) Patient is a 23 y.o. female presenting with abdominal pain and vomiting. The history is provided by the patient.  Abdominal Pain Associated symptoms: nausea and vomiting   Associated symptoms: no chest pain, no diarrhea and no shortness of breath   Emesis Associated symptoms: abdominal pain   Associated symptoms: no diarrhea and no headaches    patient presents with few day history of back pain abdominal pain. She states it hurts from her shoulders down to her lower back. She states she's had problems with her back before. She states that the abdominal pain has been over the last day or 2. Consider upper abdomen. She's had decreased oral intake. She's had nausea with some vomiting. She has had no diarrhea. She's had some mild constipation. No fevers. No cough. Said a decreased appetite. No dysuria. She denies vaginal bleeding or discharge. When asked about possibility of pregnancy she states "I shouldn't be on birth control pills."  Past Medical History  Diagnosis Date  . No pertinent past medical history   . Urinary tract infection   . Ovarian cyst   . Gonorrhea   . Chlamydia   . Hx of gonorrhea   . History of acute PID     had bilateral tubovarian abcesses sec to gonorrhea and chlamydia   Past Surgical History  Procedure Laterality Date  . No past surgeries     Family History  Problem Relation Age of Onset  . Other Neg Hx   . Hearing loss Neg Hx   . Hypertension Mother   . Cancer Mother 27    lung  . Bipolar disorder Mother   . Cancer Maternal Uncle     lung  . Heart disease Maternal Grandfather   . Diabetes Maternal Grandfather   . Hypertension Maternal Grandfather   .  Cancer Maternal Grandfather     prostate  . Heart disease Paternal Grandfather     grandfather, great grandfather  . Bipolar disorder Sister   . Polydactyly Sister    History  Substance Use Topics  . Smoking status: Current Every Day Smoker -- 0.25 packs/day for 7 years    Types: Cigarettes  . Smokeless tobacco: Never Used  . Alcohol Use: No   OB History   Grav Para Term Preterm Abortions TAB SAB Ect Mult Living   2 1 1  1  1   1      Review of Systems  Constitutional: Negative for activity change and appetite change.  Eyes: Negative for pain.  Respiratory: Negative for chest tightness and shortness of breath.   Cardiovascular: Negative for chest pain and leg swelling.  Gastrointestinal: Positive for nausea, vomiting and abdominal pain. Negative for diarrhea.  Genitourinary: Negative for flank pain.  Musculoskeletal: Positive for back pain. Negative for neck stiffness.  Skin: Negative for rash.  Neurological: Negative for weakness, numbness and headaches.  Psychiatric/Behavioral: Negative for behavioral problems.      Allergies  Review of patient's allergies indicates no known allergies.  Home Medications   Prior to Admission medications   Medication Sig Start Date End Date Taking? Authorizing Provider  etonogestrel (IMPLANON) 68 MG IMPL implant Inject 1 each into the skin once. 12/04/12  Yes Historical Provider, MD   BP 108/61  Pulse 66  Temp(Src) 98.5 F (36.9 C)  Resp 16  SpO2 97%  LMP 03/18/2014 Physical Exam  Nursing note and vitals reviewed. Constitutional: She is oriented to person, place, and time. She appears well-developed and well-nourished.  HENT:  Head: Normocephalic and atraumatic.  Eyes: EOM are normal. Pupils are equal, round, and reactive to light.  Neck: Normal range of motion. Neck supple.  Cardiovascular: Normal rate, regular rhythm and normal heart sounds.   No murmur heard. Pulmonary/Chest: Effort normal and breath sounds normal. No  respiratory distress. She has no wheezes. She has no rales.  Abdominal: Soft. Bowel sounds are normal. She exhibits no distension. There is tenderness. There is no rebound and no guarding.  Epigastric tenderness without rebound or guarding.  Musculoskeletal: Normal range of motion.  Neurological: She is alert and oriented to person, place, and time. No cranial nerve deficit.  Skin: Skin is warm and dry.  Psychiatric: She has a normal mood and affect. Her speech is normal.    ED Course  Procedures (including critical care time) Labs Review Labs Reviewed  URINALYSIS, ROUTINE W REFLEX MICROSCOPIC - Abnormal; Notable for the following:    Color, Urine AMBER (*)    APPearance CLOUDY (*)    Ketones, ur 40 (*)    Leukocytes, UA MODERATE (*)    All other components within normal limits  URINE MICROSCOPIC-ADD ON - Abnormal; Notable for the following:    Squamous Epithelial / LPF FEW (*)    Bacteria, UA FEW (*)    All other components within normal limits  CBC WITH DIFFERENTIAL  COMPREHENSIVE METABOLIC PANEL  LIPASE, BLOOD  POC URINE PREG, ED    Imaging Review No results found.   EKG Interpretation None      MDM   Final diagnoses:  UTI (urinary tract infection)    Patient with nausea vomiting abdominal pain. Lab work reassuring, except for UTI. Will send urine culture. Patient feels better and will be discharged home.    Jasper Riling. Alvino Chapel, MD 04/02/14 1549

## 2014-04-02 NOTE — ED Notes (Signed)
Initial contact- A&Ox4. Reports back pain started a week ago and abdominal pain started two days ago. Describes pain as "constant and aching." Pain is decreased when lying down. No hx of back surgeries. No other complaints at this time. Emesis x1 this morning, denies blood in vomit. Denies D, chills, fevers. Resting comfortably at this time.

## 2014-04-02 NOTE — Progress Notes (Signed)
  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 Pt states she hasnt been able to eat over the past couple days bc she "hasnt felt good".    dx UTI d/c with septra and zofran odt     Action/Plan:   Cm spoke with pt CM spoke with on coming EDP about changing zofran to phenergan EDP agreed.  CM provided pt with [facility] $4 med list   Action/Plan Detail:   Anticipated DC Date:  04/02/2014     Status Recommendation to Physician:   Result of Recommendation:    Other ED [place]  Other  PCP issues  PCP issues    Choice offered to / List presented to:            Status of service:  Completed, signed off  ED Comments:   ED Comments Detail:  CM spoke with pt who confirms self pay [facility] resident with no pcp. CM discussed and provided written information for self pay pcps, importance of pcp for f/u care, www.needymeds.org, discounted pharmacies and other [employer] such as financial assistance, DSS and  health department  Reviewed resources for [employer] self pay pcps like [facility], family medicine at [address], [facility] family practice, general medical clinics, [facility] urgent care plus others, [facility] out patient pharmacies and housing Pt voiced understanding and appreciation of resources provided  Provided [facility] contact information Agreed to [facility] f/u  services

## 2014-04-02 NOTE — Progress Notes (Signed)
ED CM re reviewed pt EPIC chart to see if pt d/c med change completed by EDP. Cm noted zofran ODT not changed to phenergan for cost concerns per pt request Cm spoke with correct oncoming EDP, Dr Madison Hickman.  Orders given. Orders entered in EPIC.  Cm called pt's preferred pharmacy, [facility] [phone] and spoke with Meagan after 2 telephone calls and leaving a voice message x 1 on automated services for [facility].  Reports pt has not arrived to pick up medications at time of CM call

## 2014-04-04 ENCOUNTER — Emergency Department (HOSPITAL_COMMUNITY)
Admission: EM | Admit: 2014-04-04 | Discharge: 2014-04-05 | Disposition: A | Discharge status: Home / Self Care | Payer: Medicaid Other | Attending: Emergency Medicine | Admitting: Emergency Medicine

## 2014-04-04 ENCOUNTER — Encounter (HOSPITAL_COMMUNITY): Payer: Self-pay | Admitting: Emergency Medicine

## 2014-04-04 ENCOUNTER — Emergency Department (HOSPITAL_COMMUNITY): Payer: Medicaid Other

## 2014-04-04 DIAGNOSIS — Z79899 Other long term (current) drug therapy: Secondary | ICD-10-CM | POA: Insufficient documentation

## 2014-04-04 DIAGNOSIS — Z791 Long term (current) use of non-steroidal anti-inflammatories (NSAID): Secondary | ICD-10-CM | POA: Insufficient documentation

## 2014-04-04 DIAGNOSIS — R1013 Epigastric pain: Secondary | ICD-10-CM | POA: Insufficient documentation

## 2014-04-04 DIAGNOSIS — N898 Other specified noninflammatory disorders of vagina: Secondary | ICD-10-CM | POA: Insufficient documentation

## 2014-04-04 DIAGNOSIS — R112 Nausea with vomiting, unspecified: Secondary | ICD-10-CM

## 2014-04-04 DIAGNOSIS — F17200 Nicotine dependence, unspecified, uncomplicated: Secondary | ICD-10-CM | POA: Insufficient documentation

## 2014-04-04 DIAGNOSIS — Z87440 Personal history of urinary (tract) infections: Secondary | ICD-10-CM | POA: Insufficient documentation

## 2014-04-04 DIAGNOSIS — Z8619 Personal history of other infectious and parasitic diseases: Secondary | ICD-10-CM | POA: Insufficient documentation

## 2014-04-04 DIAGNOSIS — R197 Diarrhea, unspecified: Secondary | ICD-10-CM | POA: Insufficient documentation

## 2014-04-04 LAB — CBC WITH DIFFERENTIAL/PLATELET
Basophils Absolute: 0 10*3/uL (ref 0.0–0.1)
Basophils Relative: 0 % (ref 0–1)
EOS ABS: 0 10*3/uL (ref 0.0–0.7)
EOS PCT: 0 % (ref 0–5)
HCT: 39.9 % (ref 36.0–46.0)
Hemoglobin: 13.8 g/dL (ref 12.0–15.0)
LYMPHS ABS: 2.9 10*3/uL (ref 0.7–4.0)
Lymphocytes Relative: 32 % (ref 12–46)
MCH: 30.7 pg (ref 26.0–34.0)
MCHC: 34.6 g/dL (ref 30.0–36.0)
MCV: 88.7 fL (ref 78.0–100.0)
MONOS PCT: 7 % (ref 3–12)
Monocytes Absolute: 0.6 10*3/uL (ref 0.1–1.0)
Neutro Abs: 5.6 10*3/uL (ref 1.7–7.7)
Neutrophils Relative %: 61 % (ref 43–77)
PLATELETS: 194 10*3/uL (ref 150–400)
RBC: 4.5 MIL/uL (ref 3.87–5.11)
RDW: 12.5 % (ref 11.5–15.5)
WBC: 9.2 10*3/uL (ref 4.0–10.5)

## 2014-04-04 LAB — COMPREHENSIVE METABOLIC PANEL
ALT: 9 U/L (ref 0–35)
AST: 11 U/L (ref 0–37)
Albumin: 4 g/dL (ref 3.5–5.2)
Alkaline Phosphatase: 57 U/L (ref 39–117)
BUN: 8 mg/dL (ref 6–23)
CALCIUM: 9.1 mg/dL (ref 8.4–10.5)
CO2: 22 mEq/L (ref 19–32)
Chloride: 104 mEq/L (ref 96–112)
Creatinine, Ser: 0.76 mg/dL (ref 0.50–1.10)
GFR calc Af Amer: >90 mL/min (ref >90)
GFR calc non Af Amer: >90 mL/min (ref >90)
Glucose, Bld: 86 mg/dL (ref 70–99)
Potassium: 3.9 mEq/L (ref 3.7–5.3)
SODIUM: 141 meq/L (ref 137–147)
TOTAL PROTEIN: 7.1 g/dL (ref 6.0–8.3)
Total Bilirubin: 0.4 mg/dL (ref 0.3–1.2)

## 2014-04-04 LAB — LIPASE, BLOOD: LIPASE: 12 U/L (ref 11–59)

## 2014-04-04 MED ORDER — MORPHINE SULFATE 4 MG/ML IJ SOLN
4.0000 mg | Freq: Once | INTRAMUSCULAR | Status: AC
  Administered 2014-04-04: 4 mg via INTRAVENOUS
  Filled 2014-04-04: qty 1

## 2014-04-04 MED ORDER — SODIUM CHLORIDE 0.9 % IV SOLN
80.0000 mg | Freq: Once | INTRAVENOUS | Status: AC
  Administered 2014-04-04: 80 mg via INTRAVENOUS
  Filled 2014-04-04: qty 80

## 2014-04-04 MED ORDER — ONDANSETRON HCL 4 MG/2ML IJ SOLN
4.0000 mg | Freq: Once | INTRAMUSCULAR | Status: AC
  Administered 2014-04-04: 4 mg via INTRAVENOUS
  Filled 2014-04-04: qty 2

## 2014-04-04 MED ORDER — SODIUM CHLORIDE 0.9 % IV SOLN
1000.0000 mL | INTRAVENOUS | Status: DC
  Administered 2014-04-04: 1000 mL via INTRAVENOUS

## 2014-04-04 NOTE — ED Notes (Signed)
Patient is alert and oriented x3.   She is complaining of abdominal pain that she states she was seen At [facility] ED for the same issue and Dx with a UTI.  She continues to have the same pain and states that  She is having the pain when she is eating.  Currently she is rating her pain 8 of 10.
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
## 2014-04-05 LAB — WET PREP, GENITAL
TRICH WET PREP: NONE SEEN
YEAST WET PREP: NONE SEEN

## 2014-04-05 LAB — HIV ANTIBODY (ROUTINE TESTING W REFLEX): HIV 1&2 Ab, 4th Generation: NONREACTIVE

## 2014-04-05 MED ORDER — OMEPRAZOLE 20 MG PO CPDR: 20.0000 mg | DELAYED_RELEASE_CAPSULE | Freq: Every day | ORAL | Status: DC

## 2014-04-05 MED ORDER — AZITHROMYCIN 250 MG PO TABS
1000.0000 mg | ORAL_TABLET | Freq: Once | ORAL | Status: AC
Start: 2014-04-05 — End: 2014-04-05
  Administered 2014-04-05: 1000 mg via ORAL
  Filled 2014-04-05: qty 4

## 2014-04-05 MED ORDER — CEFTRIAXONE SODIUM 250 MG IJ SOLR
250.0000 mg | Freq: Once | INTRAMUSCULAR | Status: AC
  Administered 2014-04-05: 250 mg via INTRAMUSCULAR
  Filled 2014-04-05: qty 250

## 2014-04-05 MED ORDER — HYDROCODONE-ACETAMINOPHEN 5-325 MG PO TABS: 2.0000 | ORAL_TABLET | ORAL | Status: DC | PRN

## 2014-04-05 MED ORDER — DEXTROSE 5 % IV SOLN: 1.0000 g | Freq: Once | INTRAVENOUS | Status: DC

## 2014-04-05 MED ORDER — PROMETHAZINE HCL 25 MG PO TABS: 25.0000 mg | ORAL_TABLET | Freq: Four times a day (QID) | ORAL | Status: DC | PRN

## 2014-04-05 NOTE — ED Provider Notes (Signed)
Medical screening examination/treatment/procedure(s) were performed by non-physician practitioner and as supervising physician I was immediately available for consultation/collaboration.   EKG Interpretation None       Orlie Dakin, MD 04/05/14 1441

## 2014-04-05 NOTE — Discharge Instructions (Signed)
1. Medications: phenergan, vicodin, usual home medications 2. Treatment: rest, drink plenty of fluids,  3. Follow Up: Please followup with your primary doctor for discussion of your diagnoses and further evaluation after today's visit; if you do not have a primary care doctor use the resource guide provided to find one;    Abdominal Pain, Women Abdominal (stomach, pelvic, or belly) pain can be caused by many things. It is important to tell your doctor:  The location of the pain.  Does it come and go or is it present all the time?  Are there things that start the pain (eating certain foods, exercise)?  Are there other symptoms associated with the pain (fever, nausea, vomiting, diarrhea)? All of this is helpful to know when trying to find the cause of the pain. CAUSES   Stomach: virus or bacteria infection, or ulcer.  Intestine: appendicitis (inflamed appendix), regional ileitis (Crohn's disease), ulcerative colitis (inflamed colon), irritable bowel syndrome, diverticulitis (inflamed diverticulum of the colon), or cancer of the stomach or intestine.  Gallbladder disease or stones in the gallbladder.  Kidney disease, kidney stones, or infection.  Pancreas infection or cancer.  Fibromyalgia (pain disorder).  Diseases of the female organs:  Uterus: fibroid (non-cancerous) tumors or infection.  Fallopian tubes: infection or tubal pregnancy.  Ovary: cysts or tumors.  Pelvic adhesions (scar tissue).  Endometriosis (uterus lining tissue growing in the pelvis and on the pelvic organs).  Pelvic congestion syndrome (female organs filling up with blood just before the menstrual period).  Pain with the menstrual period.  Pain with ovulation (producing an egg).  Pain with an IUD (intrauterine device, birth control) in the uterus.  Cancer of the female organs.  Functional pain (pain not caused by a disease, may improve without treatment).  Psychological  pain.  Depression. DIAGNOSIS  Your doctor will decide the seriousness of your pain by doing an examination.  Blood tests.  X-rays.  Ultrasound.  CT scan (computed tomography, special type of X-ray).  MRI (magnetic resonance imaging).  Cultures, for infection.  Barium enema (dye inserted in the large intestine, to better view it with X-rays).  Colonoscopy (looking in intestine with a lighted tube).  Laparoscopy (minor surgery, looking in abdomen with a lighted tube).  Major abdominal exploratory surgery (looking in abdomen with a large incision). TREATMENT  The treatment will depend on the cause of the pain.   Many cases can be observed and treated at home.  Over-the-counter medicines recommended by your caregiver.  Prescription medicine.  Antibiotics, for infection.  Birth control pills, for painful periods or for ovulation pain.  Hormone treatment, for endometriosis.  Nerve blocking injections.  Physical therapy.  Antidepressants.  Counseling with a psychologist or psychiatrist.  Minor or major surgery. HOME CARE INSTRUCTIONS   Do not take laxatives, unless directed by your caregiver.  Take over-the-counter pain medicine only if ordered by your caregiver. Do not take aspirin because it can cause an upset stomach or bleeding.  Try a clear liquid diet (broth or water) as ordered by your caregiver. Slowly move to a bland diet, as tolerated, if the pain is related to the stomach or intestine.  Have a thermometer and take your temperature several times a day, and record it.  Bed rest and sleep, if it helps the pain.  Avoid sexual intercourse, if it causes pain.  Avoid stressful situations.  Keep your follow-up appointments and tests, as your caregiver orders.  If the pain does not go away with medicine or surgery,  you may try:  Acupuncture.  Relaxation exercises (yoga, meditation).  Group therapy.  Counseling. SEEK MEDICAL CARE IF:   You  notice certain foods cause stomach pain.  Your home care treatment is not helping your pain.  You need stronger pain medicine.  You want your IUD removed.  You feel faint or lightheaded.  You develop nausea and vomiting.  You develop a rash.  You are having side effects or an allergy to your medicine. SEEK IMMEDIATE MEDICAL CARE IF:   Your pain does not go away or gets worse.  You have a fever.  Your pain is felt only in portions of the abdomen. The right side could possibly be appendicitis. The left lower portion of the abdomen could be colitis or diverticulitis.  You are passing blood in your stools (bright red or black tarry stools, with or without vomiting).  You have blood in your urine.  You develop chills, with or without a fever.  You pass out. MAKE SURE YOU:   Understand these instructions.  Will watch your condition.  Will get help right away if you are not doing well or get worse. Document Released: 09/10/2007 Document Revised: 02/05/2012 Document Reviewed: 09/30/2009 [facility] Patient Information 2014 [place], [place].    Emergency Department Resource Guide 1) Find a Doctor and Pay Out of Pocket Although you won't have to find out who is covered by your insurance plan, it is a good idea to ask around and get recommendations. You will then need to call the office and see if the doctor you have chosen will accept you as a new patient and what types of options they offer for patients who are self-pay. Some doctors offer discounts or will set up payment plans for their patients who do not have insurance, but you will need to ask so you aren't surprised when you get to your appointment.  2) Contact Your Local Health Department Not all health departments have doctors that can see patients for sick visits, but many do, so it is worth a call to see if yours does. If you don't know where your local health department is, you can check in your phone book. The CDC also  has a tool to help you locate your state's health department, and many state websites also have listings of all of their local health departments.  3) Find a [facility] If your illness is not likely to be very severe or complicated, you may want to try a walk in clinic. These are popping up all over the country in pharmacies, drugstores, and shopping centers. They're usually staffed by nurse practitioners or physician assistants that have been trained to treat common illnesses and complaints. They're usually fairly quick and inexpensive. However, if you have serious medical issues or chronic medical problems, these are probably not your best option.  No Primary Care Doctor: - Call Health Connect at  [phone] - they can help you locate a primary care doctor that  accepts your insurance, provides certain services, etc. - Physician Referral Service- [phone]  Chronic Pain Problems: Organization         Address  Phone   Notes  [facility]  [phone] Patients need to be referred by their primary care doctor.   Medication Assistance: Organization         Address  Phone   Notes  [facility] Medication [facility] [address] [place], [place] [address] [phone] --Must be a resident  of [place] -- Must have NO insurance coverage whatsoever (no Medicaid/ Medicare, etc.) -- The pt. MUST have a primary care doctor that directs their care regularly and follows them in the community   MedAssist  [phone]   [employer]  [phone]    Agencies that provide inexpensive medical care: Organization         Address  Phone   Notes  [place]  [phone]   [facility]    [phone]   [facility] [place], [place] [address] [phone]   [place] [address], [place] [phone]   Planned Parenthood    [phone]    [facility]    [phone]   [place] and [place], [place] Phone:  [phone], Fax:  [phone] Hours of Operation:  9 am - 6 pm, M-F.  Also accepts Medicaid/Medicare and self-pay.  [facility] for [place] [address], [address], [place] Phone: [phone], Fax: [phone]. Hours of Operation:  8:30 am - 5:30 pm, M-F.  Also accepts Medicaid and self-pay.  [facility] [place] [address] [place] Phone: [phone]   [place], [place], [place] [phone], Ext. [phone] Mondays & Thursdays: 7-9 AM.  First 15 patients are seen on a first come, first serve basis.    [facility] Providers:  Organization         Address  Phone   Notes  [facility] [address], [place] [phone] Also accepts self-pay patients.  [facility] [address], [place]  [phone]   [place], [address], [place] [phone]   [facility] [address], [place] [phone]   Lucianne Lei [address], [place]   [phone] Only accepts [place] Access [place] patients after they have their name applied to their card.   Self-Pay (no insurance) in [place] [facility]:  Organization         Address  Phone   Notes  Sickle Cell Patients, [facility] [place] [phone]   [facility] [place] [phone]   [facility] [place]  [place], [place], [place] [phone]   [facility]/Dr. Osei-Bonsu  [address] [place] or [address], [place] [phone] Phone number for both [place] and [address] locations is the same.  Urgent Medical and [facility] [address] [place] [phone]   [facility] [address] [place] or [address] [phone]   [facility] [address] [place] [phone], phone; [phone], fax Sees patients 1st and 3rd Saturday of every month.  Must not qualify for public or private insurance (i.e. Medicaid, Medicare,  Health Choice, Veterans' Benefits)  Household income should be no more than 200% of the poverty level The clinic cannot treat you if you are pregnant or think you are pregnant  Sexually transmitted diseases are not treated at the clinic.    Dental Care: Organization         Address  Phone  Notes  [facility] Department of [facility] [place], [place] [phone] Accepts children up to age 56 who are enrolled in [place] or [place]; pregnant women with a Medicaid card; and children who have applied for Medicaid or [place] Health Choice, but were declined, whose parents can pay a reduced fee at time of service.  [facility] Department of [facility]  [address], [place] [phone] Accepts children up to age 70 who are enrolled in [place] or [place]; pregnant women with a Medicaid card; and children who have applied for Medicaid or [place] Health Choice, but were declined, whose parents can pay a reduced fee at time of service.  [place] Adult Dental Access PROGRAM  [place] [phone] Patients are seen by appointment only. Walk-ins are not accepted. [place] will see patients 32 years of age and older. Monday - Tuesday (8am-5pm) Most Wednesdays (8:30-5pm) $30 per visit, cash only  [facility] Adult Dental Access PROGRAM  [address], [facility] [phone] Patients are seen by appointment only. Walk-ins are not accepted. [place] will see patients 90 years of age and older. One Wednesday  Evening (Monthly: Volunteer Based).  $30 per visit, cash only  [place]  [phone] for adults; Children under age 73, call Graduate Pediatric Dentistry at [phone]. Children aged 102-14, please call [phone] to request a pediatric application.  Dental services are provided in all areas of dental care including fillings, crowns and bridges, complete and partial dentures, implants, gum treatment, root canals, and extractions. Preventive care is also provided. Treatment is provided to both adults and children. Patients are selected via a lottery and there is often a waiting list.   [facility] [address] [place]  [phone] [email or web address]   [facility] [address] [place], [place] [phone], Ext. [phone] Second and Fourth Thursday of each month, opens at 6:30 AM; Clinic ends at 9 AM.  Patients are seen on a first-come first-served basis, and a limited number are seen during each clinic.   [facility]  [address] Hillard Danker [place], [place] [phone]   Eligibility Requirements You must have lived in [place], [place], or [place] counties for at least the last three months.   You cannot be eligible for state or federal sponsored [employer], including [employer], [place], or [employer].   You generally cannot be eligible for healthcare insurance through your employer.    How to apply: Eligibility screenings are held every Tuesday and Wednesday afternoon from 1:00 pm until 4:00 pm. You do not need an appointment for the interview!  [facility] [address], [place], [place]   [place]  [place] Department  [place]  [phone]    Behavioral Health Resources in the Community: Intensive Outpatient Programs Organization         Address  Phone  Notes  [place]. [address], [place], [place] [phone]   [facility] Outpatient [address] [place], [place]   ADS: Alcohol & Drug Svcs [address] [place], [place]   [place] [address]  [place], [place] or [phone]   Substance Abuse Resources Organization  Address  Phone  Notes  Alcohol and Drug Services  [phone]   [place]  [phone]   [facility] [place]  [phone]   Chinita Pester  [phone]   Residential & Outpatient Substance Abuse Program  [phone]   Psychological Services Organization         Address  Phone  Notes  [facility] [place]  [place]  [phone]   [place] [address], [place] or [phone]    Mobile Crisis Teams Organization         Address  Phone  Notes  [facility]atives, Mobile Crisis Care Unit  [phone]   [facility] Psychotherapeutic Services  [address] [place], [place]   [place] [address] [place] [phone]    Self-Help/Support Groups Organization         Address  Phone             Notes  [place]. of [place] - variety of support groups  [place] Call for more information  Narcotics Anonymous (NA), [facility] [address], [employer] [place]  2 meetings at this location   Special educational needs teacher         Address  Phone  Notes  ASAP Residential Treatment [place],    [place]  [phone]   [facility]  [address] [place] [id number], [place], [place]   [place], [place] [phone] Admissions: 8am-3pm M-F  Incentives Substance [place] [address]    [place], [place] [phone]   [facility] [address] [place],  [place], [place]   [facility] [address],  [place], [place]   [facility] Programs - Intensive Outpatient [address], [place], [place]   [facility] ([place] [address]  [place], [place] [phone] or [phone]   [facility]idential Treatment Services (RTS) [address] [place], [place] Accepts Medicaid  [facility] [address]  [place] [phone] Substance Abuse/Addiction Treatment   [facility] Organization         Address  Phone  Notes  [facility]  [phone]   Domenic Schwab, PhD [address] Arlis Porta [place], [place]   [phone] or [phone]   [place], [place] [phone]   [facility] [address] [place], [place] [phone] Insurance/Medicaid/sponsorship through [facility] and Families [address] [place]                                    [place], [place] [phone] [place] [address], [place] [phone]    Dr. Adele Schilder  [phone]   [facility] Dept. 1) [address] [place] 2) [place] 3)  [place] [address], [place] [phone]  [phone]   [place] [phone] or [phone] (After Hours)

## 2014-04-06 LAB — GC/CHLAMYDIA PROBE AMP
CT Probe RNA: POSITIVE — AB
GC Probe RNA: POSITIVE — AB

## 2014-04-07 ENCOUNTER — Telehealth (HOSPITAL_BASED_OUTPATIENT_CLINIC_OR_DEPARTMENT_OTHER): Payer: Self-pay | Admitting: Emergency Medicine

## 2014-04-07 NOTE — Telephone Encounter (Signed)
+   gonorrhea, + chlamydia, treated with Rocephin and Zithromax. Will contact patient. [employer]HS faxed

## 2014-09-28 ENCOUNTER — Encounter (HOSPITAL_COMMUNITY): Payer: Self-pay | Admitting: Emergency Medicine

## 2014-10-03 ENCOUNTER — Emergency Department (HOSPITAL_COMMUNITY)
Admission: EM | Admit: 2014-10-03 | Discharge: 2014-10-03 | Disposition: A | Discharge status: Home / Self Care | Payer: Medicaid Other | Attending: Emergency Medicine | Admitting: Emergency Medicine

## 2014-10-03 ENCOUNTER — Encounter (HOSPITAL_COMMUNITY): Payer: Self-pay | Admitting: *Deleted

## 2014-10-03 ENCOUNTER — Emergency Department (HOSPITAL_COMMUNITY): Payer: Medicaid Other

## 2014-10-03 DIAGNOSIS — Z792 Long term (current) use of antibiotics: Secondary | ICD-10-CM | POA: Insufficient documentation

## 2014-10-03 DIAGNOSIS — Z720 Tobacco use: Secondary | ICD-10-CM | POA: Diagnosis not present

## 2014-10-03 DIAGNOSIS — Z8619 Personal history of other infectious and parasitic diseases: Secondary | ICD-10-CM | POA: Diagnosis not present

## 2014-10-03 DIAGNOSIS — S6991XA Unspecified injury of right wrist, hand and finger(s), initial encounter: Secondary | ICD-10-CM | POA: Diagnosis not present

## 2014-10-03 DIAGNOSIS — S3992XA Unspecified injury of lower back, initial encounter: Secondary | ICD-10-CM | POA: Insufficient documentation

## 2014-10-03 DIAGNOSIS — Z87440 Personal history of urinary (tract) infections: Secondary | ICD-10-CM | POA: Insufficient documentation

## 2014-10-03 DIAGNOSIS — Y9289 Other specified places as the place of occurrence of the external cause: Secondary | ICD-10-CM | POA: Insufficient documentation

## 2014-10-03 DIAGNOSIS — M545 Low back pain, unspecified: Secondary | ICD-10-CM

## 2014-10-03 DIAGNOSIS — Z79899 Other long term (current) drug therapy: Secondary | ICD-10-CM | POA: Insufficient documentation

## 2014-10-03 DIAGNOSIS — Y998 Other external cause status: Secondary | ICD-10-CM | POA: Diagnosis not present

## 2014-10-03 DIAGNOSIS — M533 Sacrococcygeal disorders, not elsewhere classified: Secondary | ICD-10-CM

## 2014-10-03 DIAGNOSIS — Y9389 Activity, other specified: Secondary | ICD-10-CM | POA: Diagnosis not present

## 2014-10-03 DIAGNOSIS — W109XXA Fall (on) (from) unspecified stairs and steps, initial encounter: Secondary | ICD-10-CM | POA: Insufficient documentation

## 2014-10-03 DIAGNOSIS — M67431 Ganglion, right wrist: Secondary | ICD-10-CM

## 2014-10-03 DIAGNOSIS — Z8742 Personal history of other diseases of the female genital tract: Secondary | ICD-10-CM | POA: Insufficient documentation

## 2014-10-03 MED ORDER — TRAMADOL HCL 50 MG PO TABS: 50.0000 mg | ORAL_TABLET | Freq: Four times a day (QID) | ORAL | Status: DC | PRN

## 2014-10-03 MED ORDER — HYDROCODONE-ACETAMINOPHEN 5-325 MG PO TABS
2.0000 | ORAL_TABLET | Freq: Once | ORAL | Status: AC
  Administered 2014-10-03: 2 via ORAL
  Filled 2014-10-03: qty 2

## 2014-10-03 NOTE — ED Notes (Signed)
Patient belongings: Shoes, short, jacket, socks and cell phone

## 2014-10-03 NOTE — ED Notes (Addendum)
Pt reports she fell down 5 steps a few days ago, landed on her tailbone/lower back area. Now having lower back pain 9/10. Also reports she has "knot/mass" on right wrist x1 month that now hurts on cold days. Pt ambulatory.

## 2014-10-03 NOTE — ED Provider Notes (Signed)
CSN: [id number]     Arrival date & time 10/03/14  1042 History   First MD Initiated Contact with Patient 10/03/14 1047     Chief Complaint  Patient presents with  . Back Pain  . Wrist Pain     (Consider location/radiation/quality/duration/timing/severity/associated sxs/prior Treatment) HPI Comments: Patient presents today with pain of her coccyx and lower back.   Pain has been constant since slipping on wet stairs four days ago.  She states that she fell unto her buttocks when she slipped and then continued to slide down four more stairs on her buttocks.  She denies hitting her head or LOC.  Pain does not radiate.  She has taken Motrin and Tylenol for her pain and also used a heating pad without improvement.  She has been ambulatory since the fall.  She denies numbness, tingling, bowel/bladder incontinence, swelling, or bruising of the area.    Patient is also complaining of a bump on the volar aspect of the right wrist that has been present for the past month.  She reports that initially the area was not tender, but  has been tender over the past couple of days.   She denies any change in the size.  She has full ROM of the wrist.  No injury or trauma to the area.  Denies any erythema or drainage.  No fever or chills.    The history is provided by the patient.    Past Medical History  Diagnosis Date  . No pertinent past medical history   . Urinary tract infection   . Ovarian cyst   . Gonorrhea   . Chlamydia   . Hx of gonorrhea   . History of acute PID     had bilateral tubovarian abcesses sec to gonorrhea and chlamydia   Past Surgical History  Procedure Laterality Date  . No past surgeries     Family History  Problem Relation Age of Onset  . Other Neg Hx   . Hearing loss Neg Hx   . Hypertension Mother   . Cancer Mother 38    lung  . Bipolar disorder Mother   . Cancer Maternal Uncle     lung  . Heart disease Maternal Grandfather   . Diabetes Maternal Grandfather   .  Hypertension Maternal Grandfather   . Cancer Maternal Grandfather     prostate  . Heart disease Paternal Grandfather     grandfather, great grandfather  . Bipolar disorder Sister   . Polydactyly Sister    History  Substance Use Topics  . Smoking status: Current Every Day Smoker -- 0.25 packs/day for 7 years    Types: Cigarettes  . Smokeless tobacco: Never Used  . Alcohol Use: Yes   OB History    Gravida Para Term Preterm AB TAB SAB Ectopic Multiple Living   2 1 1  1  1   1      Review of Systems  All other systems reviewed and are negative.     Allergies  Review of patient's allergies indicates no known allergies.  Home Medications   Prior to Admission medications   Medication Sig Start Date End Date Taking? Authorizing Provider  acetaminophen (TYLENOL) 500 MG tablet Take 1,000 mg by mouth every 6 (six) hours as needed for headache.    Historical Provider, MD  etonogestrel (IMPLANON) 68 MG IMPL implant Inject 1 each into the skin once. 12/04/12   Historical Provider, MD  HYDROcodone-acetaminophen (NORCO/VICODIN) 5-325 MG per tablet Take 2 tablets  by mouth every 4 (four) hours as needed. 04/05/14   Hannah Muthersbaugh, [name]  ibuprofen (ADVIL,MOTRIN) 200 MG tablet Take 200 mg by mouth once.    Historical Provider, MD  omeprazole (PRILOSEC) 20 MG capsule Take 1 capsule (20 mg total) by mouth daily. 04/05/14   Hannah Muthersbaugh, [name]  promethazine (PHENERGAN) 25 MG tablet Take 1 tablet (25 mg total) by mouth every 6 (six) hours as needed for nausea or vomiting. 04/05/14   Jarrett Soho Muthersbaugh, [name]  sulfamethoxazole-trimethoprim (BACTRIM DS) 800-160 MG per tablet Take 1 tablet by mouth 2 (two) times daily. 04/02/14   Jasper Riling. Pickering, MD   BP 118/74 mmHg  Pulse 92  Temp(Src) 99 F (37.2 C) (Oral)  Resp 16  SpO2 100%  LMP 10/02/2014 Physical Exam  Constitutional: She appears well-developed and well-nourished.  HENT:  Head: Normocephalic and atraumatic.  Mouth/Throat:  Oropharynx is clear and moist.  Neck: Normal range of motion. Neck supple.  Cardiovascular: Normal rate, regular rhythm and normal heart sounds.   Pulses:      Dorsalis pedis pulses are 2+ on the right side, and 2+ on the left side.  Pulmonary/Chest: Effort normal and breath sounds normal.  Abdominal: Soft. Bowel sounds are normal. She exhibits no distension and no mass. There is no tenderness. There is no rebound and no guarding.  Musculoskeletal: Normal range of motion.       Cervical back: She exhibits normal range of motion, no tenderness, no bony tenderness, no swelling, no edema and no deformity.       Thoracic back: She exhibits normal range of motion, no tenderness, no bony tenderness, no swelling, no edema and no deformity.       Lumbar back: She exhibits tenderness and bony tenderness. She exhibits normal range of motion, no swelling, no edema and no deformity.  Full ROM of upper and lower extremities bilaterally Tenderness to palpation of the coccyx.  No bruising, swelling, or erythema. 1 cm Ganglion cyst of the volar aspect of the right wrist.  No erythema, edema, or warmth of the area.  Full ROM of the right wrist    Neurological: She is alert. She has normal strength. No sensory deficit. Gait normal.  Distal sensation of both feet intact Distal sensation of the right hand intact.  Skin: Skin is warm and dry.  Psychiatric: She has a normal mood and affect.  Nursing note and vitals reviewed.   ED Course  Procedures (including critical care time) Labs Review Labs Reviewed - No data to display  Imaging Review Dg Lumbar Spine Complete  10/03/2014   CLINICAL DATA:  23 year old female with persistent lower back and tailbone pain after falling down 5 stairs earlier this week.  EXAM: LUMBAR SPINE - COMPLETE 4+ VIEW  COMPARISON:  Concurrently obtained radiographs of the sacrum  FINDINGS: There is no evidence of lumbar spine fracture. Alignment is normal. Intervertebral disc spaces  are maintained.  IMPRESSION: Negative.   Electronically Signed   By: Jacqulynn Cadet M.D.   On: 10/03/2014 12:21   Dg Sacrum/coccyx  10/03/2014   CLINICAL DATA:  Lower back pain after fall down stairs. Initial encounter  EXAM: SACRUM AND COCCYX - 2+ VIEW  COMPARISON:  None.  FINDINGS: There is no evidence of fracture or other focal bone lesions.  IMPRESSION: Negative.   Electronically Signed   By: Jorje Guild M.D.   On: 10/03/2014 12:22     EKG Interpretation None      MDM   Final  diagnoses:  Lower back pain   Patient presents with pain of the lower back and coccyx that has been present since falling down stairs four days ago.  Xrays today is negative.  Patient neurovascularly intact.  Patient is ambulatory.  Patient also complaining of a bump to the right wrist x 1 month.  On exam area is consistent with a Ganglion Cyst.  No signs of infection.  Patient given referral to Hand Surgery if the area continues to cause her pain.  Patient stable for discharge.  Return precautions given.    Hyman Bible, [name] 10/03/14 [place], MD 10/03/14 1538

## 2015-02-09 ENCOUNTER — Emergency Department (HOSPITAL_COMMUNITY)
Admission: EM | Admit: 2015-02-09 | Discharge: 2015-02-09 | Disposition: A | Discharge status: Home / Self Care | Payer: Medicaid Other | Attending: Emergency Medicine | Admitting: Emergency Medicine

## 2015-02-09 ENCOUNTER — Encounter (HOSPITAL_COMMUNITY): Payer: Self-pay

## 2015-02-09 DIAGNOSIS — R1013 Epigastric pain: Secondary | ICD-10-CM | POA: Insufficient documentation

## 2015-02-09 DIAGNOSIS — Z720 Tobacco use: Secondary | ICD-10-CM | POA: Diagnosis not present

## 2015-02-09 DIAGNOSIS — Z87440 Personal history of urinary (tract) infections: Secondary | ICD-10-CM | POA: Diagnosis not present

## 2015-02-09 DIAGNOSIS — Z792 Long term (current) use of antibiotics: Secondary | ICD-10-CM | POA: Diagnosis not present

## 2015-02-09 DIAGNOSIS — R197 Diarrhea, unspecified: Secondary | ICD-10-CM | POA: Insufficient documentation

## 2015-02-09 DIAGNOSIS — Z79899 Other long term (current) drug therapy: Secondary | ICD-10-CM | POA: Diagnosis not present

## 2015-02-09 DIAGNOSIS — Z8619 Personal history of other infectious and parasitic diseases: Secondary | ICD-10-CM | POA: Insufficient documentation

## 2015-02-09 DIAGNOSIS — Z3202 Encounter for pregnancy test, result negative: Secondary | ICD-10-CM | POA: Insufficient documentation

## 2015-02-09 DIAGNOSIS — Z8742 Personal history of other diseases of the female genital tract: Secondary | ICD-10-CM | POA: Insufficient documentation

## 2015-02-09 LAB — CBC WITH DIFFERENTIAL/PLATELET
BASOS PCT: 0 % (ref 0–1)
Basophils Absolute: 0 10*3/uL (ref 0.0–0.1)
Eosinophils Absolute: 0 10*3/uL (ref 0.0–0.7)
Eosinophils Relative: 0 % (ref 0–5)
HCT: 41.6 % (ref 36.0–46.0)
Hemoglobin: 14 g/dL (ref 12.0–15.0)
LYMPHS ABS: 2.3 10*3/uL (ref 0.7–4.0)
LYMPHS PCT: 27 % (ref 12–46)
MCH: 30.3 pg (ref 26.0–34.0)
MCHC: 33.7 g/dL (ref 30.0–36.0)
MCV: 90 fL (ref 78.0–100.0)
MONOS PCT: 4 % (ref 3–12)
Monocytes Absolute: 0.3 10*3/uL (ref 0.1–1.0)
Neutro Abs: 6 10*3/uL (ref 1.7–7.7)
Neutrophils Relative %: 69 % (ref 43–77)
PLATELETS: ADEQUATE 10*3/uL (ref 150–400)
RBC: 4.62 MIL/uL (ref 3.87–5.11)
RDW: 12.9 % (ref 11.5–15.5)
WBC: 8.6 10*3/uL (ref 4.0–10.5)

## 2015-02-09 LAB — COMPREHENSIVE METABOLIC PANEL
ALT: 13 U/L (ref 0–35)
ANION GAP: 3 — AB (ref 5–15)
AST: 17 U/L (ref 0–37)
Albumin: 4.2 g/dL (ref 3.5–5.2)
Alkaline Phosphatase: 52 U/L (ref 39–117)
BUN: <5 mg/dL — ABNORMAL LOW (ref 6–23)
CO2: 23 mmol/L (ref 19–32)
Calcium: 8.4 mg/dL (ref 8.4–10.5)
Chloride: 111 mmol/L (ref 96–112)
Creatinine, Ser: 0.55 mg/dL (ref 0.50–1.10)
GFR calc Af Amer: >90 mL/min (ref >90)
Glucose, Bld: 90 mg/dL (ref 70–99)
Potassium: 4 mmol/L (ref 3.5–5.1)
Sodium: 137 mmol/L (ref 135–145)
Total Bilirubin: 0.7 mg/dL (ref 0.3–1.2)
Total Protein: 6.9 g/dL (ref 6.0–8.3)

## 2015-02-09 LAB — URINALYSIS, ROUTINE W REFLEX MICROSCOPIC
Bilirubin Urine: NEGATIVE
GLUCOSE, UA: NEGATIVE mg/dL
Hgb urine dipstick: NEGATIVE
KETONES UR: NEGATIVE mg/dL
LEUKOCYTES UA: NEGATIVE
NITRITE: NEGATIVE
PROTEIN: NEGATIVE mg/dL
Specific Gravity, Urine: 1.015 (ref 1.005–1.030)
Urobilinogen, UA: 1 mg/dL (ref 0.0–1.0)
pH: 7.5 (ref 5.0–8.0)

## 2015-02-09 LAB — POC URINE PREG, ED: Preg Test, Ur: NEGATIVE

## 2015-02-09 LAB — LIPASE, BLOOD: Lipase: 14 U/L (ref 11–59)

## 2015-02-09 MED ORDER — GI COCKTAIL ~~LOC~~
30.0000 mL | Freq: Once | ORAL | Status: AC
  Administered 2015-02-09: 30 mL via ORAL
  Filled 2015-02-09: qty 30

## 2015-02-09 MED ORDER — PANTOPRAZOLE SODIUM 20 MG PO TBEC
20.0000 mg | DELAYED_RELEASE_TABLET | Freq: Once | ORAL | Status: AC
  Administered 2015-02-09: 20 mg via ORAL
  Filled 2015-02-09 (×2): qty 1

## 2015-02-09 MED ORDER — PANTOPRAZOLE SODIUM 20 MG PO TBEC: 20.0000 mg | DELAYED_RELEASE_TABLET | Freq: Every day | ORAL | Status: DC

## 2015-02-09 NOTE — ED Notes (Signed)
ASKED PT FOR URINE SAMPLE,PT STATES SHE IS UNABLE TO PROVIDE ONE AT THIS TIME.

## 2015-02-09 NOTE — ED Notes (Signed)
Pt c/o generalized abdominal pain and intermittent n/v/d x 2 weeks.  Pain score 8/10.  Pt reports that symptoms are only right after she wakes up.  Pt has taken Zantac w/o relief.

## 2015-02-09 NOTE — ED Notes (Signed)
Pt informed of need for urine sample, pt states unable to void at this time. Pt encouraged to attempt to urinate.

## 2015-02-09 NOTE — ED Provider Notes (Signed)
CSN: [id number]     Arrival date & time 02/09/15  5885 History   First MD Initiated Contact with Patient 02/09/15 1106     Chief Complaint  Patient presents with  . Abdominal Pain  . Diarrhea     (Consider location/radiation/quality/duration/timing/severity/associated sxs/prior Treatment) HPI Comments: Patient presents today with a chief complaint of abdominal pain.  Chief complaint also listed as diarrhea, but she states that she has not had any diarrhea.  She reports that she has been getting the pain in the morning daily over the past 2 weeks.  She reports that the pain is located in the epigastric area and does not radiate.  Pain associated with 2-3 episodes of vomiting.  No blood in her emesis.  She reports that her pain always resolves at lunch time after a couple of hours.   She states that she typically takes Zantac for the pain, which had been helping.  However, over the past couple of days the Zantac did not help.  She has noticed that the pain is worse when she is stressed and also was worse 2 weeks ago when she drank alcohol.  She does not drink alcohol regularly.  She denies fever, chills, diarrhea, urinary symptoms, constipation, or vaginal discharge.  Last BM was this morning, which was normal.  No known history of GERD or PUD.  The history is provided by the patient.    Past Medical History  Diagnosis Date  . No pertinent past medical history   . Urinary tract infection   . Ovarian cyst   . Gonorrhea   . Chlamydia   . Hx of gonorrhea   . History of acute PID     had bilateral tubovarian abcesses sec to gonorrhea and chlamydia   Past Surgical History  Procedure Laterality Date  . No past surgeries     Family History  Problem Relation Age of Onset  . Other Neg Hx   . Hearing loss Neg Hx   . Hypertension Mother   . Cancer Mother 47    lung  . Bipolar disorder Mother   . Cancer Maternal Uncle     lung  . Heart disease Maternal Grandfather   . Diabetes Maternal  Grandfather   . Hypertension Maternal Grandfather   . Cancer Maternal Grandfather     prostate  . Heart disease Paternal Grandfather     grandfather, great grandfather  . Bipolar disorder Sister   . Polydactyly Sister    History  Substance Use Topics  . Smoking status: Current Every Day Smoker -- 0.25 packs/day for 7 years    Types: Cigarettes  . Smokeless tobacco: Never Used  . Alcohol Use: Yes     Comment: occ   OB History    Gravida Para Term Preterm AB TAB SAB Ectopic Multiple Living   2 1 1  1  1   1      Review of Systems  All other systems reviewed and are negative.     Allergies  Review of patient's allergies indicates no known allergies.  Home Medications   Prior to Admission medications   Medication Sig Start Date End Date Taking? Authorizing Provider  acetaminophen (TYLENOL) 500 MG tablet Take 1,000 mg by mouth every 6 (six) hours as needed for headache.    Historical Provider, MD  etonogestrel (IMPLANON) 68 MG IMPL implant Inject 1 each into the skin once. 12/04/12   Historical Provider, MD  HYDROcodone-acetaminophen (NORCO/VICODIN) 5-325 MG per tablet Take 2 tablets  by mouth every 4 (four) hours as needed. 04/05/14   Hannah Muthersbaugh, [name]  ibuprofen (ADVIL,MOTRIN) 200 MG tablet Take 200 mg by mouth once.    Historical Provider, MD  omeprazole (PRILOSEC) 20 MG capsule Take 1 capsule (20 mg total) by mouth daily. 04/05/14   Hannah Muthersbaugh, [name]  promethazine (PHENERGAN) 25 MG tablet Take 1 tablet (25 mg total) by mouth every 6 (six) hours as needed for nausea or vomiting. 04/05/14   Jarrett Soho Muthersbaugh, [name]  sulfamethoxazole-trimethoprim (BACTRIM DS) 800-160 MG per tablet Take 1 tablet by mouth 2 (two) times daily. 04/02/14   Davonna Belling, MD  traMADol (ULTRAM) 50 MG tablet Take 1 tablet (50 mg total) by mouth every 6 (six) hours as needed. 10/03/14   Lawrnce Reyez, [name]   BP 133/73 mmHg  Pulse 74  Temp(Src) 97.6 F (36.4 C) (Oral)  Resp 16  SpO2  100%  LMP 01/11/2015 (Approximate) Physical Exam  Constitutional: She appears well-developed and well-nourished. No distress.  HENT:  Head: Normocephalic and atraumatic.  Mouth/Throat: Oropharynx is clear and moist.  Neck: Normal range of motion. Neck supple.  Cardiovascular: Normal rate, regular rhythm and normal heart sounds.   Pulmonary/Chest: Effort normal and breath sounds normal.  Abdominal: Soft. Bowel sounds are normal. She exhibits no distension and no mass. There is tenderness. There is no rebound and no guarding.  Mild epigastric tenderness to palpation  Musculoskeletal: Normal range of motion.  Neurological: She is alert.  Skin: Skin is warm and dry. She is not diaphoretic.  Psychiatric: She has a normal mood and affect.  Nursing note and vitals reviewed.   ED Course  Procedures (including critical care time) Labs Review Labs Reviewed  CBC WITH DIFFERENTIAL/PLATELET  COMPREHENSIVE METABOLIC PANEL  LIPASE, BLOOD  URINALYSIS, ROUTINE W REFLEX MICROSCOPIC  POC URINE PREG, ED    Imaging Review No results found.   EKG Interpretation None     1:30 PM Reassessed patient.  She denies abdominal pain at this time.  Abdomen soft and non tender.  Labs pending.  Apparently the labs were lost in the lab. MDM   Final diagnoses:  None   Patient presents today with abdominal pain x 2 weeks.  She reports that the pain has been occurring daily in the mornings.  Pain worse when she is under stress.  Patient with epigastric tenderness on exam, but no rebound or guarding.  She reports one episode of vomiting this morning, but no vomiting in the ED.  No hematemesis.  Epigastric pain completely resolved after given GI cocktail and Protonix.  Labs unremarkable.  Do not feel that imaging is indicated at this time.  Patient given referral to GI.  Stable for discharge.  Instructed to take Protonix.  Return precautions given.     Hyman Bible, [name] 02/10/15 [place], MD 02/13/15 1355

## 2015-03-19 ENCOUNTER — Emergency Department (HOSPITAL_COMMUNITY)
Admission: EM | Admit: 2015-03-19 | Discharge: 2015-03-19 | Disposition: A | Discharge status: Home / Self Care | Payer: Medicaid Other | Attending: Emergency Medicine | Admitting: Emergency Medicine

## 2015-03-19 ENCOUNTER — Emergency Department (HOSPITAL_COMMUNITY): Payer: Medicaid Other

## 2015-03-19 ENCOUNTER — Encounter (HOSPITAL_COMMUNITY): Payer: Self-pay | Admitting: Physical Medicine and Rehabilitation

## 2015-03-19 DIAGNOSIS — Z720 Tobacco use: Secondary | ICD-10-CM | POA: Diagnosis not present

## 2015-03-19 DIAGNOSIS — N390 Urinary tract infection, site not specified: Secondary | ICD-10-CM | POA: Diagnosis not present

## 2015-03-19 DIAGNOSIS — Z8619 Personal history of other infectious and parasitic diseases: Secondary | ICD-10-CM | POA: Insufficient documentation

## 2015-03-19 DIAGNOSIS — Z3202 Encounter for pregnancy test, result negative: Secondary | ICD-10-CM | POA: Insufficient documentation

## 2015-03-19 DIAGNOSIS — R1013 Epigastric pain: Secondary | ICD-10-CM | POA: Insufficient documentation

## 2015-03-19 DIAGNOSIS — Z79899 Other long term (current) drug therapy: Secondary | ICD-10-CM | POA: Diagnosis not present

## 2015-03-19 DIAGNOSIS — R1084 Generalized abdominal pain: Secondary | ICD-10-CM | POA: Diagnosis present

## 2015-03-19 DIAGNOSIS — R52 Pain, unspecified: Secondary | ICD-10-CM

## 2015-03-19 DIAGNOSIS — K824 Cholesterolosis of gallbladder: Secondary | ICD-10-CM | POA: Diagnosis not present

## 2015-03-19 LAB — URINALYSIS, ROUTINE W REFLEX MICROSCOPIC
BILIRUBIN URINE: NEGATIVE
GLUCOSE, UA: NEGATIVE mg/dL
KETONES UR: NEGATIVE mg/dL
Leukocytes, UA: NEGATIVE
Nitrite: NEGATIVE
PROTEIN: NEGATIVE mg/dL
Specific Gravity, Urine: 1.016 (ref 1.005–1.030)
Urobilinogen, UA: 0.2 mg/dL (ref 0.0–1.0)
pH: 7 (ref 5.0–8.0)

## 2015-03-19 LAB — CBC WITH DIFFERENTIAL/PLATELET
BASOS PCT: 0 % (ref 0–1)
Basophils Absolute: 0 10*3/uL (ref 0.0–0.1)
EOS PCT: 0 % (ref 0–5)
Eosinophils Absolute: 0 10*3/uL (ref 0.0–0.7)
HCT: 43.4 % (ref 36.0–46.0)
HEMOGLOBIN: 14.7 g/dL (ref 12.0–15.0)
Lymphocytes Relative: 27 % (ref 12–46)
Lymphs Abs: 2.1 10*3/uL (ref 0.7–4.0)
MCH: 30.1 pg (ref 26.0–34.0)
MCHC: 33.9 g/dL (ref 30.0–36.0)
MCV: 88.9 fL (ref 78.0–100.0)
MONO ABS: 0.4 10*3/uL (ref 0.1–1.0)
Monocytes Relative: 5 % (ref 3–12)
NEUTROS PCT: 68 % (ref 43–77)
Neutro Abs: 5.1 10*3/uL (ref 1.7–7.7)
Platelets: 185 10*3/uL (ref 150–400)
RBC: 4.88 MIL/uL (ref 3.87–5.11)
RDW: 12.5 % (ref 11.5–15.5)
WBC: 7.6 10*3/uL (ref 4.0–10.5)

## 2015-03-19 LAB — COMPREHENSIVE METABOLIC PANEL
ALBUMIN: 4.1 g/dL (ref 3.5–5.2)
ALT: 14 U/L (ref 0–35)
AST: 18 U/L (ref 0–37)
Alkaline Phosphatase: 52 U/L (ref 39–117)
Anion gap: 11 (ref 5–15)
CHLORIDE: 109 mmol/L (ref 96–112)
CO2: 19 mmol/L (ref 19–32)
CREATININE: 0.64 mg/dL (ref 0.50–1.10)
Calcium: 8.9 mg/dL (ref 8.4–10.5)
GFR calc non Af Amer: >90 mL/min (ref >90)
GLUCOSE: 97 mg/dL (ref 70–99)
Potassium: 4.1 mmol/L (ref 3.5–5.1)
Sodium: 139 mmol/L (ref 135–145)
Total Bilirubin: 0.3 mg/dL (ref 0.3–1.2)
Total Protein: 6.9 g/dL (ref 6.0–8.3)

## 2015-03-19 LAB — POC URINE PREG, ED: Preg Test, Ur: NEGATIVE

## 2015-03-19 LAB — URINE MICROSCOPIC-ADD ON

## 2015-03-19 LAB — LIPASE, BLOOD: Lipase: 22 U/L (ref 11–59)

## 2015-03-19 MED ORDER — OMEPRAZOLE 20 MG PO CPDR: 20.0000 mg | DELAYED_RELEASE_CAPSULE | Freq: Every day | ORAL | Status: DC

## 2015-03-19 MED ORDER — NITROFURANTOIN MONOHYD MACRO 100 MG PO CAPS: 100.0000 mg | ORAL_CAPSULE | Freq: Two times a day (BID) | ORAL | Status: DC

## 2015-03-19 NOTE — ED Notes (Signed)
Phlebotomy at bedside.

## 2015-03-19 NOTE — ED Notes (Signed)
Pt presents to department for evaluation of diffuse abdominal pain and nausea/vomiting x4 months. Pt reports increased pain this morning. Pt is alert and oriented x4.

## 2015-03-19 NOTE — Discharge Instructions (Signed)
Abdominal Pain °Many things can cause abdominal pain. Usually, abdominal pain is not caused by a disease and will improve without treatment. It can often be observed and treated at home. Your health care provider will do a physical exam and possibly order blood tests and X-rays to help determine the seriousness of your pain. However, in many cases, more time must pass before a clear cause of the pain can be found. Before that point, your health care provider may not know if you need more testing or further treatment. °HOME CARE INSTRUCTIONS  °Monitor your abdominal pain for any changes. The following actions may help to alleviate any discomfort you are experiencing: °· Only take over-the-counter or prescription medicines as directed by your health care provider. °· Do not take laxatives unless directed to do so by your health care provider. °· Try a clear liquid diet (broth, tea, or water) as directed by your health care provider. Slowly move to a bland diet as tolerated. °SEEK MEDICAL CARE IF: °· You have unexplained abdominal pain. °· You have abdominal pain associated with nausea or diarrhea. °· You have pain when you urinate or have a bowel movement. °· You experience abdominal pain that wakes you in the night. °· You have abdominal pain that is worsened or improved by eating food. °· You have abdominal pain that is worsened with eating fatty foods. °· You have a fever. °SEEK IMMEDIATE MEDICAL CARE IF:  °· Your pain does not go away within 2 hours. °· You keep throwing up (vomiting). °· Your pain is felt only in portions of the abdomen, such as the right side or the left lower portion of the abdomen. °· You pass bloody or black tarry stools. °MAKE SURE YOU: °· Understand these instructions.   °· Will watch your condition.   °· Will get help right away if you are not doing well or get worse.   °Document Released: 08/23/2005 Document Revised: 11/18/2013 Document Reviewed: 07/23/2013 °ExitCare® Patient Information  ©2015 [employer]. This information is not intended to replace advice given to you by your health care provider. Make sure you discuss any questions you have with your health care provider. °Urinary Tract Infection °Urinary tract infections (UTIs) can develop anywhere along your urinary tract. Your urinary tract is your body's drainage system for removing wastes and extra water. Your urinary tract includes two kidneys, two ureters, a bladder, and a urethra. Your kidneys are a pair of bean-shaped organs. Each kidney is about the size of your fist. They are located below your ribs, one on each side of your spine. °CAUSES °Infections are caused by microbes, which are microscopic organisms, including fungi, viruses, and bacteria. These organisms are so small that they can only be seen through a microscope. Bacteria are the microbes that most commonly cause UTIs. °SYMPTOMS  °Symptoms of UTIs may vary by age and gender of the patient and by the location of the infection. Symptoms in young women typically include a frequent and intense urge to urinate and a painful, burning feeling in the bladder or urethra during urination. Older women and men are more likely to be tired, shaky, and weak and have muscle aches and abdominal pain. A fever may mean the infection is in your kidneys. Other symptoms of a kidney infection include pain in your back or sides below the ribs, nausea, and vomiting. °DIAGNOSIS °To diagnose a UTI, your caregiver will ask you about your symptoms. Your caregiver also will ask to provide a urine sample. The urine sample   will be tested for bacteria and white blood cells. White blood cells are made by your body to help fight infection. °TREATMENT  °Typically, UTIs can be treated with medication. Because most UTIs are caused by a bacterial infection, they usually can be treated with the use of antibiotics. The choice of antibiotic and length of treatment depend on your symptoms and the type of bacteria  causing your infection. °HOME CARE INSTRUCTIONS °· If you were prescribed antibiotics, take them exactly as your caregiver instructs you. Finish the medication even if you feel better after you have only taken some of the medication. °· Drink enough water and fluids to keep your urine clear or pale yellow. °· Avoid caffeine, tea, and carbonated beverages. They tend to irritate your bladder. °· Empty your bladder often. Avoid holding urine for long periods of time. °· Empty your bladder before and after sexual intercourse. °· After a bowel movement, women should cleanse from front to back. Use each tissue only once. °SEEK MEDICAL CARE IF:  °· You have back pain. °· You develop a fever. °· Your symptoms do not begin to resolve within 3 days. °SEEK IMMEDIATE MEDICAL CARE IF:  °· You have severe back pain or lower abdominal pain. °· You develop chills. °· You have nausea or vomiting. °· You have continued burning or discomfort with urination. °MAKE SURE YOU:  °· Understand these instructions. °· Will watch your condition. °· Will get help right away if you are not doing well or get worse. °Document Released: 08/23/2005 Document Revised: 05/14/2012 Document Reviewed: 12/22/2011 °ExitCare® Patient Information ©2015 [employer]. This information is not intended to replace advice given to you by your health care provider. Make sure you discuss any questions you have with your health care provider. ° °

## 2015-03-19 NOTE — ED Notes (Signed)
Patient refused wheelchair. 

## 2015-03-19 NOTE — ED Notes (Signed)
Patient returned from US.

## 2015-03-19 NOTE — ED Notes (Signed)
Patient transported to Ultrasound 

## 2015-03-19 NOTE — ED Notes (Signed)
Pt ambulated to restroom with steady gait.

## 2015-03-19 NOTE — ED Notes (Signed)
MD at bedside. 

## 2015-03-19 NOTE — ED Provider Notes (Signed)
CSN: [id number]     Arrival date & time 03/19/15  2376 History   First MD Initiated Contact with Patient 03/19/15 1020     Chief Complaint  Patient presents with  . Abdominal Pain  . Emesis     (Consider location/radiation/quality/duration/timing/severity/associated sxs/prior Treatment) Patient is a 24 y.o. female presenting with abdominal pain and vomiting. The history is provided by the patient. No language interpreter was used.  Abdominal Pain Pain location:  Generalized Pain quality: aching   Pain radiates to:  Does not radiate Pain severity:  Moderate Onset quality:  Gradual Timing:  Constant Progression:  Worsening Chronicity:  New Context: not sick contacts   Relieved by:  Nothing Worsened by:  Nothing tried Ineffective treatments:  None tried Associated symptoms: vomiting   Risk factors: has not had multiple surgeries   Emesis Associated symptoms: abdominal pain   Pt was seen at [facility] for same a month ago.  Pt reports she wants to have an ultrasound of her gallbladder.  Pt was referred to Gi but has been unable to get her MD to do referral for medicaid.  Pt is in process of changing primary.   Past Medical History  Diagnosis Date  . No pertinent past medical history   . Urinary tract infection   . Ovarian cyst   . Gonorrhea   . Chlamydia   . Hx of gonorrhea   . History of acute PID     had bilateral tubovarian abcesses sec to gonorrhea and chlamydia   Past Surgical History  Procedure Laterality Date  . No past surgeries     Family History  Problem Relation Age of Onset  . Other Neg Hx   . Hearing loss Neg Hx   . Hypertension Mother   . Cancer Mother 53    lung  . Bipolar disorder Mother   . Cancer Maternal Uncle     lung  . Heart disease Maternal Grandfather   . Diabetes Maternal Grandfather   . Hypertension Maternal Grandfather   . Cancer Maternal Grandfather     prostate  . Heart disease Paternal Grandfather     grandfather, great grandfather   . Bipolar disorder Sister   . Polydactyly Sister    History  Substance Use Topics  . Smoking status: Current Every Day Smoker -- 0.25 packs/day for 7 years    Types: Cigarettes  . Smokeless tobacco: Never Used  . Alcohol Use: Yes     Comment: occ   OB History    Gravida Para Term Preterm AB TAB SAB Ectopic Multiple Living   2 1 1  1  1   1      Review of Systems  Gastrointestinal: Positive for vomiting and abdominal pain.  All other systems reviewed and are negative.     Allergies  Review of patient's allergies indicates no known allergies.  Home Medications   Prior to Admission medications   Medication Sig Start Date End Date Taking? Authorizing Provider  acetaminophen (TYLENOL) 500 MG tablet Take 1,000 mg by mouth every 6 (six) hours as needed for headache.   Yes Historical Provider, MD  etonogestrel (IMPLANON) 68 MG IMPL implant Inject 1 each into the skin once. Every 3 years. Pt was due to have implant removed 11/2014 12/04/12  Yes Historical Provider, MD  ibuprofen (ADVIL,MOTRIN) 200 MG tablet Take 200 mg by mouth once.   Yes Historical Provider, MD  pantoprazole (PROTONIX) 20 MG tablet Take 1 tablet (20 mg total) by mouth daily.  02/09/15  Yes Heather Laisure, [name]  HYDROcodone-acetaminophen (NORCO/VICODIN) 5-325 MG per tablet Take 2 tablets by mouth every 4 (four) hours as needed. Patient not taking: Reported on 02/09/2015 04/05/14   [facility] Muthersbaugh, [name]  omeprazole (PRILOSEC) 20 MG capsule Take 1 capsule (20 mg total) by mouth daily. Patient not taking: Reported on 02/09/2015 04/05/14   [facility] Muthersbaugh, [name]  promethazine (PHENERGAN) 25 MG tablet Take 1 tablet (25 mg total) by mouth every 6 (six) hours as needed for nausea or vomiting. Patient not taking: Reported on 02/09/2015 04/05/14   [facility] Muthersbaugh, [name]  sulfamethoxazole-trimethoprim (BACTRIM DS) 800-160 MG per tablet Take 1 tablet by mouth 2 (two) times daily. Patient not taking: Reported on 02/09/2015  04/02/14   Davonna Belling, MD  traMADol (ULTRAM) 50 MG tablet Take 1 tablet (50 mg total) by mouth every 6 (six) hours as needed. Patient not taking: Reported on 02/09/2015 10/03/14   Heather Laisure, [name]   BP 109/65 mmHg  Pulse 63  Temp(Src) 98.1 F (36.7 C) (Oral)  Resp 14  SpO2 97% Physical Exam  Constitutional: She appears well-developed and well-nourished.  HENT:  Head: Normocephalic and atraumatic.  Right Ear: External ear normal.  Left Ear: External ear normal.  Eyes: EOM are normal. Pupils are equal, round, and reactive to light.  Neck: Normal range of motion. Neck supple.  Cardiovascular: Normal rate and normal heart sounds.   Pulmonary/Chest: Effort normal.  Abdominal: Soft. There is tenderness.  Tender epigastric area,  Diffusely.  Musculoskeletal: Normal range of motion.  Neurological: She is alert.  Skin: Skin is warm.  Nursing note and vitals reviewed.   ED Course  Procedures (including critical care time) Labs Review Labs Reviewed  URINALYSIS, ROUTINE W REFLEX MICROSCOPIC - Abnormal; Notable for the following:    Color, Urine AMBER (*)    Hgb urine dipstick LARGE (*)    All other components within normal limits  COMPREHENSIVE METABOLIC PANEL - Abnormal; Notable for the following:    BUN <5 (*)    All other components within normal limits  URINE MICROSCOPIC-ADD ON - Abnormal; Notable for the following:    Squamous Epithelial / LPF FEW (*)    Bacteria, UA MANY (*)    All other components within normal limits  CBC WITH DIFFERENTIAL/PLATELET  LIPASE, BLOOD  POC URINE PREG, ED    Imaging Review US Abdomen Complete  03/19/2015   CLINICAL DATA:  Abdominal pain  EXAM: ULTRASOUND ABDOMEN COMPLETE  COMPARISON:  None.  FINDINGS: Gallbladder: Small gallbladder polyp is noted measuring 5 mm. No wall thickening or pericholecystic fluid is noted. No definitive gallstones are seen.  Common bile duct: Diameter: 5.5 mm.  Liver: No focal lesion identified. Within normal  limits in parenchymal echogenicity.  IVC: No abnormality visualized.  Pancreas: Visualized portion unremarkable.  Spleen: Size and appearance within normal limits.  Right Kidney: Length: 12.2 cm. Echogenicity within normal limits. No mass or hydronephrosis visualized.  Left Kidney: Length: 13.0 cm. Echogenicity within normal limits. No mass or hydronephrosis visualized.  Abdominal aorta: No aneurysm visualized.  Other findings: None.  IMPRESSION: Small gallbladder polyp.  No acute abnormality is seen.   Electronically Signed   By: Inez Catalina M.D.   On: 03/19/2015 13:29     EKG Interpretation None      MDM Pt has possibe uti.   I advised Gi follow up as previously advised.  Pt given rx for macrobid.   Pt advised continue prilosec   Final diagnoses:  Pain  Epigastric pain  Gallbladder polyp  UTI (lower urinary tract infection)    macrobid prilosec    Hollace Kinnier [place], [name] 03/19/15 [place], MD 03/22/15 [phone]

## 2015-04-01 ENCOUNTER — Ambulatory Visit (INDEPENDENT_AMBULATORY_CARE_PROVIDER_SITE_OTHER): Payer: Medicaid Other | Admitting: Internal Medicine

## 2015-04-01 ENCOUNTER — Encounter: Payer: Self-pay | Admitting: Internal Medicine

## 2015-04-01 VITALS — BP 108/64 | HR 70 | Ht 70.5 in | Wt 189.0 lb

## 2015-04-01 DIAGNOSIS — R1013 Epigastric pain: Secondary | ICD-10-CM | POA: Diagnosis not present

## 2015-04-01 DIAGNOSIS — K824 Cholesterolosis of gallbladder: Secondary | ICD-10-CM

## 2015-04-01 DIAGNOSIS — R1011 Right upper quadrant pain: Secondary | ICD-10-CM | POA: Diagnosis not present

## 2015-04-01 NOTE — Progress Notes (Signed)
Referred by: Clinic, General Medical  Subjective:    Patient ID: Crystal Owens, female    DOB: [birth year], 24 y.o.   MRN: [id number] Chief complaint: Epigastric pain right upper quadrant pain nausea and vomiting HPI Patient is a pleasant 24 year old woman with a several month history of epigastric pain right upper quadrant pain radiating into the infrascapular area. It tends to occur every morning with nausea and vomiting. She lost her job at by her house so because she was sick. She seems to get better as the day goes on. There have been some associated loose stools. She has been to the emergency department where she was found to have a gallbladder polyp but otherwise unremarkable abdominal ultrasound. She's had a mildly abnormal urinalysis in those visits on one occasion but otherwise it was normal. She denies any urinary symptoms. He was treated for a possible UTI which did not help. Pregnancy test negative and she says she is not sexually active at this time. She is on omeprazole which helps some with heartburn symptoms and has not really relieve this pain.  She tells me her mother had similar symptoms are noted at her gallbladder removed around her age as well. Unfortunately her mother has lung cancer metastatic to the brain at this time.  The patient smokes but is willing to quit and has been counseled to do so. She does not use alcohol or drugs. No Known Allergies Outpatient Prescriptions Prior to Visit  Medication Sig Dispense Refill  . etonogestrel (IMPLANON) 68 MG IMPL implant Inject 1 each into the skin once. Every 3 years. Pt was due to have implant removed 11/2014    . omeprazole (PRILOSEC) 20 MG capsule Take 1 capsule (20 mg total) by mouth daily. 30 capsule 0  . promethazine (PHENERGAN) 25 MG tablet Take 1 tablet (25 mg total) by mouth every 6 (six) hours as needed for nausea or vomiting. 12 tablet 0  . acetaminophen (TYLENOL) 500 MG tablet Take 1,000 mg by mouth every 6 (six)  hours as needed for headache.    Marland Kitchen HYDROcodone-acetaminophen (NORCO/VICODIN) 5-325 MG per tablet Take 2 tablets by mouth every 4 (four) hours as needed. (Patient not taking: Reported on 02/09/2015) 6 tablet 0  . ibuprofen (ADVIL,MOTRIN) 200 MG tablet Take 200 mg by mouth once.    . nitrofurantoin, macrocrystal-monohydrate, (MACROBID) 100 MG capsule Take 1 capsule (100 mg total) by mouth 2 (two) times daily. 10 capsule 0  . pantoprazole (PROTONIX) 20 MG tablet Take 1 tablet (20 mg total) by mouth daily. 30 tablet 0  . sulfamethoxazole-trimethoprim (BACTRIM DS) 800-160 MG per tablet Take 1 tablet by mouth 2 (two) times daily. (Patient not taking: Reported on 02/09/2015) 6 tablet 0  . traMADol (ULTRAM) 50 MG tablet Take 1 tablet (50 mg total) by mouth every 6 (six) hours as needed. (Patient not taking: Reported on 02/09/2015) 15 tablet 0   No facility-administered medications prior to visit.   Past Medical History  Diagnosis Date  . Urinary tract infection   . Ovarian cyst   . Chlamydia   . Hx of gonorrhea   . History of acute PID     had bilateral tubovarian abcesses sec to gonorrhea and chlamydia   Past Surgical History  Procedure Laterality Date  . No past surgeries     History   Social History  . Marital Status: Single    Spouse Name: N/A  . Number of Children: 1  . Years of Education: N/A  Occupational History  . 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 Smoking status: Current Every Day Smoker -- 0.25 packs/day for 7 years    Types: Cigarettes  . Smokeless tobacco: Never Used  . Alcohol Use: 0.0 oz/week    0 Standard drinks or equivalent per week     Comment: occ  . Drug Use: No  . Sexual Activity: Yes    Birth Control/ Protection: Implant   Other Topics Concern  . None   Social History Narrative   Single, one daughter born 2013    Evaluated from [employer]   Not employed at current time lost her job because of illness   Lives with her  mother who has metastatic lung cancer   5 Dr. Samson Frederic a day I have instructed her to reduce this   04/01/2015      Family History  Problem Relation Age of Onset  . Other Neg Hx   . Hearing loss Neg Hx   . Hypertension Mother   . Cancer Mother 17    lung  . Bipolar disorder Mother   . Cancer Maternal Uncle     lung  . Heart disease Maternal Grandfather   . Diabetes Maternal Grandfather   . Hypertension Maternal Grandfather   . Cancer Maternal Grandfather     prostate  . Heart disease Paternal Grandfather     grandfather, great grandfather  . Bipolar disorder Sister   . Polydactyly Sister    Review of Systems As per history of present illness having some headaches which are a little better she attributes these to not eating because eating makes her feel worse. Some back pain as mentioned above. All review of systems are negative.    Objective:   Physical Exam @BP  108/64 mmHg  Pulse 70  Ht 5' 10.5" (1.791 m)  Wt 189 lb (85.73 kg)  BMI 26.73 kg/m2@  General:  Well-developed, well-nourished and in no acute distress Eyes:  anicteric. ENT:   Mouth and posterior pharynx free of lesions.  Neck:   supple w/o thyromegaly or mass.  Lungs: Clear to auscultation bilaterally. Heart:  S1S2, no rubs, murmurs, gallops. Abdomen:  soft, mildly tender epigastrium more so than right upper quadrant, no hepatosplenomegaly, hernia, or mass and BS+. There is no clear CVA tenderness though she is a little bit tender in the right infrascapular area with percussion with the fist Lymph:  no cervical or supraclavicular adenopathy. Extremities:   no edema, cyanosis or clubbing Skin   no rash. Neuro:  A&O x 3.  Psych:  appropriate mood and  Affect.   Data Reviewed: As per history of present illness I've reviewed the ED visits in March and April 2016  Includes labs and x-ray studies and notes     Assessment & Plan:   1. Abdominal pain, epigastric   2. RUQ pain   3. Gallbladder polyp    This  sounds most like gallbladder disease to me. We'll order a HIDA scan with ejection fraction. I think a surgery referral for their evaluation is appropriate as well. She will need that done through primary care because of her [place] access [employer]. Upper endoscopy could be performed I think it would add little but would do it if a surgeon thought it was necessary. HIDA scan might tell us more and lead more towards cholecystectomy then current symptom complex and gallbladder polyp. Low-fat diet is advised. I've advised her to reduce her caffeine  and soda intake.  I appreciate the opportunity to care for this patient. I will send a copy to general medical clinic on [place].

## 2015-04-01 NOTE — Patient Instructions (Signed)
You have been scheduled for a HIDA scan at [facility]iology (1st floor) on May 20th 2016. Please arrive 15 minutes prior to your scheduled appointment at  0:56PV. Make certain not to have anything to eat or drink at least 6 hours prior to your test. Should this appointment date or time not work well for you, please call radiology scheduling at [phone].  _____________________________________________________________________ hepatobiliary (HIDA) scan is an imaging procedure used to diagnose problems in the liver, gallbladder and bile ducts. In the HIDA scan, a radioactive chemical or tracer is injected into a vein in your arm. The tracer is handled by the liver like bile. Bile is a fluid produced and excreted by your liver that helps your digestive system break down fats in the foods you eat. Bile is stored in your gallbladder and the gallbladder releases the bile when you eat a meal. A special nuclear medicine scanner (gamma camera) tracks the flow of the tracer from your liver into your gallbladder and small intestine.  During your HIDA scan  You'll be asked to change into a hospital gown before your HIDA scan begins. Your health care team will position you on a table, usually on your back. The radioactive tracer is then injected into a vein in your arm.The tracer travels through your bloodstream to your liver, where it's taken up by the bile-producing cells. The radioactive tracer travels with the bile from your liver into your gallbladder and through your bile ducts to your small intestine.You may feel some pressure while the radioactive tracer is injected into your vein. As you lie on the table, a special gamma camera is positioned over your abdomen taking pictures of the tracer as it moves through your body. The gamma camera takes pictures continually for about an hour. You'll need to keep still during the HIDA scan. This can become uncomfortable, but you may find that you can lessen the discomfort  by taking deep breaths and thinking about other things. Tell your health care team if you're uncomfortable. The radiologist will watch on a computer the progress of the radioactive tracer through your body. The HIDA scan may be stopped when the radioactive tracer is seen in the gallbladder and enters your small intestine. This typically takes about an hour. In some cases extra imaging will be performed if original images aren't satisfactory, if morphine is given to help visualize the gallbladder or if the medication CCK is given to look at the contraction of the gallbladder. This test typically takes 2 hours to complete. ________________________________________________________________________  We will send today's notes to your PCP Dr. York Ram.  You have to be referred by them to [facility] to eval and treat you for your right upper quadrant pain and gallbladder polyp because of the type of insurance you have.   I appreciate the opportunity to care for you. Silvano Rusk, M.D., [facility]

## 2015-04-07 ENCOUNTER — Encounter: Payer: Self-pay | Admitting: Internal Medicine

## 2015-04-07 NOTE — Progress Notes (Signed)
Patient ID: Crystal Owens, female   DOB: 1991/09/21, 23 y.o.   MRN: [id number] Faxed copy of 04/01/15 office note to fax # [phone] for Dr. Herbert Moors at [facility] Their phone # is [phone].

## 2015-04-16 ENCOUNTER — Ambulatory Visit (HOSPITAL_COMMUNITY)
Admission: RE | Admit: 2015-04-16 | Discharge: 2015-04-16 | Disposition: A | Discharge status: Home / Self Care | Payer: Medicaid Other | Source: Ambulatory Visit | Attending: Internal Medicine | Admitting: Internal Medicine

## 2015-04-16 DIAGNOSIS — R1011 Right upper quadrant pain: Secondary | ICD-10-CM

## 2015-04-16 DIAGNOSIS — K824 Cholesterolosis of gallbladder: Secondary | ICD-10-CM | POA: Insufficient documentation

## 2015-04-16 DIAGNOSIS — R1013 Epigastric pain: Secondary | ICD-10-CM | POA: Diagnosis not present

## 2015-04-16 MED ORDER — TECHNETIUM TC 99M MEBROFENIN IV KIT
5.4000 | PACK | Freq: Once | INTRAVENOUS | Status: AC | PRN
  Administered 2015-04-16: 5 via INTRAVENOUS

## 2015-04-16 MED ORDER — SINCALIDE 5 MCG IJ SOLR
0.0200 ug/kg | Freq: Once | INTRAMUSCULAR | Status: AC
  Administered 2015-04-16: 1.7 ug via INTRAVENOUS

## 2015-04-17 NOTE — Progress Notes (Signed)
Quick Note:  Gallbladder working normally but still could be the problem Let her know - she should have a [facility] appt already ______

## 2015-06-17 ENCOUNTER — Encounter: Payer: Self-pay | Admitting: Internal Medicine

## 2015-06-21 ENCOUNTER — Telehealth: Payer: Self-pay

## 2015-06-21 NOTE — Telephone Encounter (Signed)
Left message for patient to call back  

## 2015-06-21 NOTE — Telephone Encounter (Signed)
-----   Message from Gatha Mayer, MD sent at 06/20/2015  6:46 AM EDT ----- Regarding: needs EGD [place],  Dr. Donne Hazel has seen her and thinks EGD appropriate to evaluate abdominal pain and probably no cholecystectomy.  Please arrange EGD direct if patient is willing - we did discuss that she may need that.  If she has an office visit we can cancel that unless she wants to keep it.  Thanks

## 2015-06-22 NOTE — Telephone Encounter (Signed)
Left message for patient to call back  

## 2015-06-25 NOTE — Telephone Encounter (Signed)
Patient has already been scheduled for pre-visit and EGD

## 2015-07-14 ENCOUNTER — Ambulatory Visit (AMBULATORY_SURGERY_CENTER): Payer: Self-pay | Admitting: *Deleted

## 2015-07-14 VITALS — Ht 70.0 in | Wt 181.0 lb

## 2015-07-14 DIAGNOSIS — R1013 Epigastric pain: Secondary | ICD-10-CM

## 2015-07-14 NOTE — Progress Notes (Signed)
Patient denies any allergies to eggs or soy. Patient denies any problems with anesthesia/sedation. Patient denies any oxygen use at home and does not take any diet/weight loss medications. EMMI education assisgned to patient on EGD, this was explained and instructions given to patient. 

## 2015-07-29 ENCOUNTER — Ambulatory Visit (AMBULATORY_SURGERY_CENTER): Payer: Medicaid Other | Admitting: Internal Medicine

## 2015-07-29 ENCOUNTER — Encounter: Payer: Self-pay | Admitting: Internal Medicine

## 2015-07-29 VITALS — BP 106/78 | HR 65 | Temp 97.1°F | Resp 27 | Ht 70.0 in | Wt 181.0 lb

## 2015-07-29 DIAGNOSIS — R1013 Epigastric pain: Secondary | ICD-10-CM

## 2015-07-29 MED ORDER — SODIUM CHLORIDE 0.9 % IV SOLN: 500.0000 mL | INTRAVENOUS | Status: DC

## 2015-07-29 NOTE — Patient Instructions (Addendum)
The exam was normal. Please return to Dr. Donne Hazel re: removing gallbladder.  I appreciate the opportunity to care for you. Gatha Mayer, MD, FACG    YOU HAD AN ENDOSCOPIC PROCEDURE TODAY AT [facility]:   Refer to the procedure report that was given to you for any specific questions about what was found during the examination.  If the procedure report does not answer your questions, please call your gastroenterologist to clarify.  If you requested that your care partner not be given the details of your procedure findings, then the procedure report has been included in a sealed envelope for you to review at your convenience later.  YOU SHOULD EXPECT: Some feelings of bloating in the abdomen. Passage of more gas than usual.  Walking can help get rid of the air that was put into your GI tract during the procedure and reduce the bloating. If you had a lower endoscopy (such as a colonoscopy or flexible sigmoidoscopy) you may notice spotting of blood in your stool or on the toilet paper. If you underwent a bowel prep for your procedure, you may not have a normal bowel movement for a few days.  Please Note:  You might notice some irritation and congestion in your nose or some drainage.  This is from the oxygen used during your procedure.  There is no need for concern and it should clear up in a day or so.  SYMPTOMS TO REPORT IMMEDIATELY:     Following upper endoscopy (EGD)  Vomiting of blood or coffee ground material  New chest pain or pain under the shoulder blades  Painful or persistently difficult swallowing  New shortness of breath  Fever of 100F or higher  Black, tarry-looking stools  For urgent or emergent issues, a gastroenterologist can be reached at any hour by calling [phone].   DIET: Your first meal following the procedure should be a small meal and then it is ok to progress to your normal diet. Heavy or fried foods are harder to digest and may  make you feel nauseous or bloated.  Likewise, meals heavy in dairy and vegetables can increase bloating.  Drink plenty of fluids but you should avoid alcoholic beverages for 24 hours.  ACTIVITY:  You should plan to take it easy for the rest of today and you should NOT DRIVE or use heavy machinery until tomorrow (because of the sedation medicines used during the test).    FOLLOW UP: Our staff will call the number listed on your records the next business day following your procedure to check on you and address any questions or concerns that you may have regarding the information given to you following your procedure. If we do not reach you, we will leave a message.  However, if you are feeling well and you are not experiencing any problems, there is no need to return our call.  We will assume that you have returned to your regular daily activities without incident.  If any biopsies were taken you will be contacted by phone or by letter within the next 1-3 weeks.  Please call us at [phone] if you have not heard about the biopsies in 3 weeks.    SIGNATURES/CONFIDENTIALITY: You and/or your care partner have signed paperwork which will be entered into your electronic medical record.  These signatures attest to the fact that that the information above on your After Visit Summary has been reviewed and is understood.  Full responsibility of the  confidentiality of this discharge information lies with you and/or your care-partner.   DR WAKEFIELD-[phone]

## 2015-07-29 NOTE — Op Note (Signed)
[place]  [address] [place], [address]   ENDOSCOPY PROCEDURE REPORT  PATIENT: Crystal Owens, Crystal Owens  MR#: [id number] BIRTHDATE: 1991-07-10 , 23  yrs. old GENDER: female ENDOSCOPIST: Gatha Mayer, MD, [facility] PROCEDURE DATE:  07/29/2015 PROCEDURE:  EGD, diagnostic ASA CLASS:     Class I INDICATIONS:  epigastric pain. MEDICATIONS: Propofol 150 mg IV and Monitored anesthesia care TOPICAL ANESTHETIC: none  DESCRIPTION OF PROCEDURE: After the risks benefits and alternatives of the procedure were thoroughly explained, informed consent was obtained.  The LB [id number] endoscope was introduced through the mouth and advanced to the second portion of the duodenum , Without limitations.  The instrument was slowly withdrawn as the mucosa was fully examined.      EXAM: The esophagus and gastroesophageal junction were completely normal in appearance.  The stomach was entered and closely examined.The antrum, angularis, and lesser curvature were well visualized, including a retroflexed view of the cardia and fundus. The stomach wall was normally distensable.  The scope passed easily through the pylorus into the duodenum.  Retroflexed views revealed no abnormalities.     The scope was then withdrawn from the patient and the procedure completed.  COMPLICATIONS: There were no immediate complications.  ENDOSCOPIC IMPRESSION: Normal appearing esophagus and GE junction, the stomach was well visualized and normal in appearance, normal appearing duodenum  RECOMMENDATIONS: Return to Dr.  Donne Hazel   eSigned:  Gatha Mayer, MD, [facility] 07/29/2015 10:57 AM    CC: Serita Grammes, MD and The Patient

## 2015-07-29 NOTE — Progress Notes (Signed)
Pt refused to take ekg patches off saying she will take them off at home. Explained ti pt and carepartner the longer they stay on more chance of skin irritation but pt refused to let me remove them .

## 2015-07-29 NOTE — Progress Notes (Signed)
Transferred to recovery room. A/O x3, pleased with MAC.  VSS.  Report to Penny, RN. 

## 2015-07-30 ENCOUNTER — Telehealth: Payer: Self-pay | Admitting: *Deleted

## 2015-07-30 NOTE — Telephone Encounter (Signed)
  Follow up Call-  Call back number 07/29/2015  Post procedure Call Back phone  # [phone]  Permission to leave phone message Yes     Message left to call us if necessary.

## 2015-08-30 ENCOUNTER — Encounter (HOSPITAL_COMMUNITY): Payer: Self-pay | Admitting: *Deleted

## 2015-08-30 NOTE — Progress Notes (Signed)
I was unable to reach patient by phone.  I left  A message on voice mail.  I instructed the patient to arrive at [place] entrance at 9:45 , nothing to eat or drink after midnight.   I instructed the patient to take the following medications in the am with just enough water to get them down: ?if she still takes Phenergan and Omeprazole- may take it.  I instructed her to not take any other medication unless she call pre op in am and get the ok from a RN.   I asked the patient to call [phone], in the am if there were any questions or problems.

## 2015-08-30 NOTE — Progress Notes (Signed)
I was able to reach patient by phone for SDW call.

## 2015-08-31 ENCOUNTER — Encounter (HOSPITAL_COMMUNITY)
Admission: RE | Disposition: A | Discharge status: Home / Self Care | Payer: Self-pay | Source: Ambulatory Visit | Attending: General Surgery

## 2015-08-31 ENCOUNTER — Ambulatory Visit (HOSPITAL_COMMUNITY)
Admission: RE | Admit: 2015-08-31 | Discharge: 2015-08-31 | Disposition: A | Discharge status: Home / Self Care | Payer: Medicaid Other | Source: Ambulatory Visit | Attending: General Surgery | Admitting: General Surgery

## 2015-08-31 ENCOUNTER — Ambulatory Visit (HOSPITAL_COMMUNITY): Payer: Medicaid Other | Admitting: Anesthesiology

## 2015-08-31 ENCOUNTER — Encounter (HOSPITAL_COMMUNITY): Payer: Self-pay | Admitting: *Deleted

## 2015-08-31 DIAGNOSIS — F17210 Nicotine dependence, cigarettes, uncomplicated: Secondary | ICD-10-CM | POA: Diagnosis not present

## 2015-08-31 DIAGNOSIS — K811 Chronic cholecystitis: Secondary | ICD-10-CM | POA: Insufficient documentation

## 2015-08-31 DIAGNOSIS — K824 Cholesterolosis of gallbladder: Secondary | ICD-10-CM | POA: Diagnosis present

## 2015-08-31 HISTORY — PX: CHOLECYSTECTOMY: SHX55

## 2015-08-31 LAB — CBC
HCT: 38.9 % (ref 36.0–46.0)
HEMOGLOBIN: 13.5 g/dL (ref 12.0–15.0)
MCH: 31.1 pg (ref 26.0–34.0)
MCHC: 34.7 g/dL (ref 30.0–36.0)
MCV: 89.6 fL (ref 78.0–100.0)
Platelets: 185 10*3/uL (ref 150–400)
RBC: 4.34 MIL/uL (ref 3.87–5.11)
RDW: 12.5 % (ref 11.5–15.5)
WBC: 7.8 10*3/uL (ref 4.0–10.5)

## 2015-08-31 LAB — COMPREHENSIVE METABOLIC PANEL
ALBUMIN: 3.8 g/dL (ref 3.5–5.0)
ALK PHOS: 51 U/L (ref 38–126)
ALT: 13 U/L — AB (ref 14–54)
AST: 14 U/L — AB (ref 15–41)
Anion gap: 10 (ref 5–15)
BUN: 8 mg/dL (ref 6–20)
CALCIUM: 8.9 mg/dL (ref 8.9–10.3)
CO2: 23 mmol/L (ref 22–32)
CREATININE: 0.69 mg/dL (ref 0.44–1.00)
Chloride: 107 mmol/L (ref 101–111)
GFR calc Af Amer: >60 mL/min (ref >60)
GFR calc non Af Amer: >60 mL/min (ref >60)
GLUCOSE: 87 mg/dL (ref 65–99)
Potassium: 4.1 mmol/L (ref 3.5–5.1)
SODIUM: 140 mmol/L (ref 135–145)
Total Bilirubin: 0.6 mg/dL (ref 0.3–1.2)
Total Protein: 6.2 g/dL — ABNORMAL LOW (ref 6.5–8.1)

## 2015-08-31 LAB — HCG, SERUM, QUALITATIVE: PREG SERUM: NEGATIVE

## 2015-08-31 SURGERY — LAPAROSCOPIC CHOLECYSTECTOMY
Anesthesia: General | Site: Abdomen

## 2015-08-31 MED ORDER — ROCURONIUM BROMIDE 100 MG/10ML IV SOLN
INTRAVENOUS | Status: DC | PRN
  Administered 2015-08-31: 30 mg via INTRAVENOUS

## 2015-08-31 MED ORDER — KETOROLAC TROMETHAMINE 15 MG/ML IJ SOLN: 15.0000 mg | Freq: Four times a day (QID) | INTRAMUSCULAR | Status: DC | PRN

## 2015-08-31 MED ORDER — CEFAZOLIN SODIUM-DEXTROSE 2-3 GM-% IV SOLR
2.0000 g | Freq: Once | INTRAVENOUS | Status: AC
  Administered 2015-08-31: 2 g via INTRAVENOUS
  Filled 2015-08-31: qty 50

## 2015-08-31 MED ORDER — PROPOFOL 10 MG/ML IV BOLUS
INTRAVENOUS | Status: AC
  Filled 2015-08-31: qty 20

## 2015-08-31 MED ORDER — SODIUM CHLORIDE 0.9 % IR SOLN
Status: DC | PRN
  Administered 2015-08-31: 1000 mL

## 2015-08-31 MED ORDER — HYDROMORPHONE HCL 1 MG/ML IJ SOLN
INTRAMUSCULAR | Status: AC
  Filled 2015-08-31: qty 1

## 2015-08-31 MED ORDER — ARTIFICIAL TEARS OP OINT
TOPICAL_OINTMENT | OPHTHALMIC | Status: DC | PRN
  Administered 2015-08-31: 1 via OPHTHALMIC

## 2015-08-31 MED ORDER — OXYCODONE HCL 5 MG PO TABS: 5.0000 mg | ORAL_TABLET | ORAL | Status: DC | PRN

## 2015-08-31 MED ORDER — SUCCINYLCHOLINE CHLORIDE 20 MG/ML IJ SOLN
INTRAMUSCULAR | Status: DC | PRN
  Administered 2015-08-31: 100 mg via INTRAVENOUS

## 2015-08-31 MED ORDER — DIPHENHYDRAMINE HCL 50 MG/ML IJ SOLN
12.5000 mg | Freq: Once | INTRAMUSCULAR | Status: AC
  Administered 2015-08-31: 12.5 mg via INTRAVENOUS

## 2015-08-31 MED ORDER — MEPERIDINE HCL 25 MG/ML IJ SOLN: 6.2500 mg | INTRAMUSCULAR | Status: DC | PRN

## 2015-08-31 MED ORDER — FENTANYL CITRATE (PF) 250 MCG/5ML IJ SOLN
INTRAMUSCULAR | Status: AC
  Filled 2015-08-31: qty 5

## 2015-08-31 MED ORDER — MIDAZOLAM HCL 2 MG/2ML IJ SOLN
INTRAMUSCULAR | Status: AC
  Filled 2015-08-31: qty 2

## 2015-08-31 MED ORDER — LACTATED RINGERS IV SOLN
INTRAVENOUS | Status: DC | PRN
  Administered 2015-08-31 (×2): via INTRAVENOUS

## 2015-08-31 MED ORDER — SUCCINYLCHOLINE CHLORIDE 20 MG/ML IJ SOLN
INTRAMUSCULAR | Status: AC
  Filled 2015-08-31: qty 1

## 2015-08-31 MED ORDER — PROPOFOL 10 MG/ML IV BOLUS
INTRAVENOUS | Status: DC | PRN
  Administered 2015-08-31: 200 mg via INTRAVENOUS

## 2015-08-31 MED ORDER — FENTANYL CITRATE (PF) 100 MCG/2ML IJ SOLN
INTRAMUSCULAR | Status: DC | PRN
  Administered 2015-08-31: 100 ug via INTRAVENOUS
  Administered 2015-08-31 (×6): 50 ug via INTRAVENOUS
  Administered 2015-08-31: 100 ug via INTRAVENOUS

## 2015-08-31 MED ORDER — NEOSTIGMINE METHYLSULFATE 10 MG/10ML IV SOLN
INTRAVENOUS | Status: AC
  Filled 2015-08-31: qty 1

## 2015-08-31 MED ORDER — ONDANSETRON HCL 4 MG/2ML IJ SOLN
INTRAMUSCULAR | Status: DC | PRN
Start: 2015-08-31 — End: 2015-08-31
  Administered 2015-08-31: 4 mg via INTRAVENOUS

## 2015-08-31 MED ORDER — HYDROMORPHONE HCL 1 MG/ML IJ SOLN: 0.2500 mg | INTRAMUSCULAR | Status: DC | PRN

## 2015-08-31 MED ORDER — OXYCODONE-ACETAMINOPHEN 10-325 MG PO TABS: 1.0000 | ORAL_TABLET | Freq: Four times a day (QID) | ORAL | Status: AC | PRN

## 2015-08-31 MED ORDER — LIDOCAINE HCL (CARDIAC) 20 MG/ML IV SOLN
INTRAVENOUS | Status: DC | PRN
  Administered 2015-08-31: 20 mg via INTRAVENOUS

## 2015-08-31 MED ORDER — LIDOCAINE HCL (CARDIAC) 20 MG/ML IV SOLN
INTRAVENOUS | Status: AC
  Filled 2015-08-31: qty 5

## 2015-08-31 MED ORDER — BUPIVACAINE-EPINEPHRINE (PF) 0.25% -1:200000 IJ SOLN
INTRAMUSCULAR | Status: AC
  Filled 2015-08-31: qty 30

## 2015-08-31 MED ORDER — GLYCOPYRROLATE 0.2 MG/ML IJ SOLN
INTRAMUSCULAR | Status: DC | PRN
  Administered 2015-08-31: 0.6 mg via INTRAVENOUS

## 2015-08-31 MED ORDER — LACTATED RINGERS IV SOLN
INTRAVENOUS | Status: DC
  Administered 2015-08-31: 12:00:00 via INTRAVENOUS

## 2015-08-31 MED ORDER — MIDAZOLAM HCL 5 MG/5ML IJ SOLN
INTRAMUSCULAR | Status: DC | PRN
  Administered 2015-08-31: 2 mg via INTRAVENOUS

## 2015-08-31 MED ORDER — DIPHENHYDRAMINE HCL 50 MG/ML IJ SOLN
INTRAMUSCULAR | Status: AC
  Filled 2015-08-31: qty 1

## 2015-08-31 MED ORDER — DEXAMETHASONE SODIUM PHOSPHATE 4 MG/ML IJ SOLN
INTRAMUSCULAR | Status: DC | PRN
  Administered 2015-08-31: 8 mg via INTRAVENOUS

## 2015-08-31 MED ORDER — GLYCOPYRROLATE 0.2 MG/ML IJ SOLN
INTRAMUSCULAR | Status: AC
  Filled 2015-08-31: qty 3

## 2015-08-31 MED ORDER — 0.9 % SODIUM CHLORIDE (POUR BTL) OPTIME
TOPICAL | Status: DC | PRN
  Administered 2015-08-31: 1000 mL

## 2015-08-31 MED ORDER — MIDAZOLAM HCL 2 MG/2ML IJ SOLN
0.5000 mg | Freq: Once | INTRAMUSCULAR | Status: AC | PRN
  Administered 2015-08-31: 1 mg via INTRAVENOUS

## 2015-08-31 MED ORDER — MIDAZOLAM HCL 2 MG/2ML IJ SOLN
INTRAMUSCULAR | Status: AC
  Filled 2015-08-31: qty 4

## 2015-08-31 MED ORDER — BUPIVACAINE-EPINEPHRINE 0.25% -1:200000 IJ SOLN
INTRAMUSCULAR | Status: DC | PRN
  Administered 2015-08-31: 15 mL

## 2015-08-31 MED ORDER — ROCURONIUM BROMIDE 50 MG/5ML IV SOLN
INTRAVENOUS | Status: AC
  Filled 2015-08-31: qty 1

## 2015-08-31 MED ORDER — DEXAMETHASONE SODIUM PHOSPHATE 4 MG/ML IJ SOLN
INTRAMUSCULAR | Status: AC
  Filled 2015-08-31: qty 2

## 2015-08-31 MED ORDER — NEOSTIGMINE METHYLSULFATE 10 MG/10ML IV SOLN
INTRAVENOUS | Status: DC | PRN
  Administered 2015-08-31: 4 mg via INTRAVENOUS

## 2015-08-31 MED ORDER — PROMETHAZINE HCL 25 MG/ML IJ SOLN: 6.2500 mg | INTRAMUSCULAR | Status: DC | PRN

## 2015-08-31 SURGICAL SUPPLY — 41 items
APPLIER CLIP 5 13 M/L LIGAMAX5 (MISCELLANEOUS) ×2
BLADE SURG ROTATE 9660 (MISCELLANEOUS) IMPLANT
CANISTER SUCTION 2500CC (MISCELLANEOUS) ×2 IMPLANT
CHLORAPREP W/TINT 26ML (MISCELLANEOUS) ×2 IMPLANT
CLIP APPLIE 5 13 M/L LIGAMAX5 (MISCELLANEOUS) ×1 IMPLANT
COVER SURGICAL LIGHT HANDLE (MISCELLANEOUS) ×2 IMPLANT
DEVICE TROCAR PUNCTURE CLOSURE (ENDOMECHANICALS) ×2 IMPLANT
DRAPE LAPAROSCOPIC ABDOMINAL (DRAPES) ×2 IMPLANT
ELECT REM PT RETURN 9FT ADLT (ELECTROSURGICAL) ×2
ELECTRODE REM PT RTRN 9FT ADLT (ELECTROSURGICAL) ×1 IMPLANT
GLOVE BIO SURGEON STRL SZ7 (GLOVE) ×2 IMPLANT
GLOVE BIO SURGEON STRL SZ8.5 (GLOVE) ×2 IMPLANT
GLOVE BIOGEL PI IND STRL 7.0 (GLOVE) ×1 IMPLANT
GLOVE BIOGEL PI IND STRL 7.5 (GLOVE) ×1 IMPLANT
GLOVE BIOGEL PI IND STRL 9 (GLOVE) ×1 IMPLANT
GLOVE BIOGEL PI INDICATOR 7.0 (GLOVE) ×1
GLOVE BIOGEL PI INDICATOR 7.5 (GLOVE) ×1
GLOVE BIOGEL PI INDICATOR 9 (GLOVE) ×1
GLOVE SURG SS PI 7.0 STRL IVOR (GLOVE) ×2 IMPLANT
GOWN STRL REUS W/ TWL LRG LVL3 (GOWN DISPOSABLE) ×3 IMPLANT
GOWN STRL REUS W/TWL LRG LVL3 (GOWN DISPOSABLE) ×3
KIT BASIN OR (CUSTOM PROCEDURE TRAY) ×2 IMPLANT
KIT ROOM TURNOVER OR (KITS) ×2 IMPLANT
LIQUID BAND (GAUZE/BANDAGES/DRESSINGS) ×2 IMPLANT
NS IRRIG 1000ML POUR BTL (IV SOLUTION) ×2 IMPLANT
PAD ARMBOARD 7.5X6 YLW CONV (MISCELLANEOUS) ×2 IMPLANT
POUCH RETRIEVAL ECOSAC 10 (ENDOMECHANICALS) ×1 IMPLANT
POUCH RETRIEVAL ECOSAC 10MM (ENDOMECHANICALS) ×1
SCISSORS LAP 5X35 DISP (ENDOMECHANICALS) ×2 IMPLANT
SET IRRIG TUBING LAPAROSCOPIC (IRRIGATION / IRRIGATOR) ×2 IMPLANT
SLEEVE ENDOPATH XCEL 5M (ENDOMECHANICALS) ×4 IMPLANT
SPECIMEN JAR SMALL (MISCELLANEOUS) ×2 IMPLANT
STRIP CLOSURE SKIN 1/2X4 (GAUZE/BANDAGES/DRESSINGS) ×2 IMPLANT
SUT MNCRL AB 4-0 PS2 18 (SUTURE) ×2 IMPLANT
SUT VICRYL 0 UR6 27IN ABS (SUTURE) ×2 IMPLANT
TOWEL OR 17X24 6PK STRL BLUE (TOWEL DISPOSABLE) ×2 IMPLANT
TOWEL OR 17X26 10 PK STRL BLUE (TOWEL DISPOSABLE) ×2 IMPLANT
TRAY LAPAROSCOPIC MC (CUSTOM PROCEDURE TRAY) ×2 IMPLANT
TROCAR XCEL BLUNT TIP 100MML (ENDOMECHANICALS) ×2 IMPLANT
TROCAR XCEL NON-BLD 5MMX100MML (ENDOMECHANICALS) ×2 IMPLANT
TUBING INSUFFLATION (TUBING) ×2 IMPLANT

## 2015-08-31 NOTE — Anesthesia Preprocedure Evaluation (Addendum)
Anesthesia Evaluation  Patient identified by MRN, date of birth, ID band Patient awake    Reviewed: Allergy & Precautions, NPO status , Patient's Chart, lab work & pertinent test results  History of Anesthesia Complications Negative for: history of anesthetic complications  Airway Mallampati: I  TM Distance: >3 FB Neck ROM: Full    Dental  (+) Dental Advisory Given   Pulmonary Current Smoker,    breath sounds clear to auscultation       Cardiovascular negative cardio ROS   Rhythm:Regular Rate:Normal     Neuro/Psych negative neurological ROS     GI/Hepatic Neg liver ROS, N/v with cholecystitis   Endo/Other  negative endocrine ROS  Renal/GU negative Renal ROS     Musculoskeletal   Abdominal   Peds  Hematology negative hematology ROS (+)   Anesthesia Other Findings   Reproductive/Obstetrics LMP presently                            Anesthesia Physical Anesthesia Plan  ASA: II  Anesthesia Plan: General   Post-op Pain Management:    Induction: Intravenous and Rapid sequence  Airway Management Planned: Oral ETT  Additional Equipment:   Intra-op Plan:   Post-operative Plan: Extubation in OR  Informed Consent: I have reviewed the patients History and Physical, chart, labs and discussed the procedure including the risks, benefits and alternatives for the proposed anesthesia with the patient or authorized representative who has indicated his/her understanding and acceptance.   Dental advisory given  Plan Discussed with: CRNA and Surgeon  Anesthesia Plan Comments: (Plan routine monitors, GETA)        Anesthesia Quick Evaluation

## 2015-08-31 NOTE — Anesthesia Postprocedure Evaluation (Signed)
  Anesthesia Post-op Note  Patient: Crystal Owens  Procedure(s) Performed: Procedure(s): LAPAROSCOPIC CHOLECYSTECTOMY (N/A)  Patient Location: PACU  Anesthesia Type:General  Level of Consciousness: awake, alert , oriented and patient cooperative  Airway and Oxygen Therapy: Patient Spontanous Breathing  Post-op Pain: none  Post-op Assessment: Post-op Vital signs reviewed, Patient's Cardiovascular Status Stable, Respiratory Function Stable, Patent Airway, No signs of Nausea or vomiting and Pain level controlled              Post-op Vital Signs: Reviewed and stable  Last Vitals:  Filed Vitals:   08/31/15 1436  BP: 118/72  Pulse: 68  Temp:   Resp: 22    Complications: No apparent anesthesia complications

## 2015-08-31 NOTE — Op Note (Signed)
Preoperative diagnosis: ruq pain, gallbladder polyp Postoperative diagnosis: chronic cholecystitis Procedure: laparoscopic cholecystectomy  Surgeon: Dr Serita Grammes Asst: Dr Verita Lamb Anesthesia: general EBL: minimal Drains none Specimen gb and contents to pathology Complications: none Sponge count correct at completion Disposition to recovery stable  Indications: This is an 51 yof with nl [place] except for polyp and nl hida. She also has nl egd.  She has symptoms typical of gallbladder disease.   We discussed proceeding with lap chole.   Procedure: After informed consent was obtained the patient was taken to the operating room. She was already given antibiotics. Sequential compression devices were on her legs. She was placed under general anesthesia without complication. Her abdomen was prepped and draped in the standard sterile surgical fashion. A surgical timeout was then performed.  I infiltrated marcaine below the umbilicus. I made an incision and then entered the fascia sharply. I then entered the peritoneum bluntly. There was no evidence of an entry injury. I placed a 0 vicryl pursestring suture and inserted a hasson trocar. I then inserted 3 further 5 mm trocars in the epigastrium and ruq. She did appear to have chronic cholecystitis. I dissected the duodenum away from the gallbladder due to scarring.  I was able to retract the gallbladder cephalad and lateral.  Eventually I was able to identify the cystic duct and clearly had the critical view of safety.The dissection in the triangle was difficult due to scarring. I was able to visualize the CBD well also. I then clipped the cystic duct and divided it. The duct was viable and the clips traversed the duct.  I then clipped the cystic artery and divided it. I then removed the gallbladder from the liver bed and placed it in a bag. It was then removed from the umbilical incision. I then obtained hemostasis and irrigated.. I  then removed the umbilical trocar and closed with 0 vicryl and the endoclose device after tying down the pursestring.  I then desufflated the abdomen and removed all my remaining trocars. I then closed these with 4-0 Monocryl and Dermabond. She tolerated this well was extubated and transferred to the recovery room in stable condition

## 2015-08-31 NOTE — OR Nursing (Signed)
Dilaudid 1.0 mg and versed 1.0 mg wasted w/ Llana Aliment RN.

## 2015-08-31 NOTE — Transfer of Care (Signed)
Immediate Anesthesia Transfer of Care Note  Patient: Crystal Owens  Procedure(s) Performed: Procedure(s): LAPAROSCOPIC CHOLECYSTECTOMY (N/A)  Patient Location: PACU  Anesthesia Type:General  Level of Consciousness: awake and patient cooperative--Pt crying.  Pt consoled, VSS.  Airway & Oxygen Therapy: Patient Spontanous Breathing and Patient connected to face mask oxygen  Post-op Assessment: Report given to RN, Post -op Vital signs reviewed and stable and Patient moving all extremities  Post vital signs: Reviewed and stable  Last Vitals:  Filed Vitals:   08/31/15 1421  BP: 152/88  Pulse: 97  Temp:   Resp: 22    Complications: No apparent anesthesia complications

## 2015-08-31 NOTE — H&P (Signed)
24 yof healthy female who presents with several months of bandlike upper abdominal pain every time she wakes up in the morning. this has been associated with n/v. she has been eating and does not have weight loss. some foods do make this worse. she does say her right back hurts at times. she underwent ruq u/s that is basically normal except for 5 mm polyp. Her hida scan shows nl ef. I sent her for egd which is negative. she returns today and reports she is having frequent ruq pain especially with eating that radiates to her back.   Past Surgical History Illene Regulus, CMA; 06/15/2015 4:14 PM) No pertinent past surgical history  Diagnostic Studies History Illene Regulus, CMA; 06/15/2015 4:14 PM) Colonoscopy never Mammogram never Pap Smear 1-5 years ago  Allergies Illene Regulus, CMA; 06/15/2015 4:15 PM) No Known Drug Allergies07/19/2016  Medication History (Alisha Spillers, CMA; 06/15/2015 4:15 PM) No Current Medications Medications Reconciled  Social History Illene Regulus, CMA; 06/15/2015 4:14 PM) Alcohol use Occasional alcohol use. Caffeine use Carbonated beverages. Tobacco use Current every day smoker.  Family History Illene Regulus, [place]; 06/15/2015 4:14 PM) Cancer Family Members In General, Mother. Heart Disease Family Members In General. Hypertension Family Members In General, Mother. Kidney Disease Family Members In General.  Pregnancy / Birth History Illene Regulus, [place]; 06/15/2015 4:14 PM) Age at menarche 62 years. Contraceptive History Intrauterine device. Gravida 2 Maternal age 24-20 Para 1 Regular periods  Review of Systems Lars Mage Spillers CMA; 06/15/2015 4:14 PM) HEENT Present- Seasonal Allergies and Wears glasses/contact lenses. Not Present- Earache, Hearing Loss, Hoarseness, Nose Bleed, Oral Ulcers, Ringing in the Ears, Sinus Pain, Sore Throat, Visual Disturbances and Yellow Eyes. Respiratory Not Present- Bloody sputum, Chronic Cough,  Difficulty Breathing, Snoring and Wheezing. Breast Not Present- Breast Mass, Breast Pain, Nipple Discharge and Skin Changes. Gastrointestinal Present- Nausea and Vomiting. Not Present- Abdominal Pain, Bloating, Bloody Stool, Change in Bowel Habits, Chronic diarrhea, Constipation, Difficulty Swallowing, Excessive gas, Gets full quickly at meals, Hemorrhoids, Indigestion and Rectal Pain. Female Genitourinary Not Present- Frequency, Nocturia, Painful Urination, Pelvic Pain and Urgency. Musculoskeletal Not Present- Back Pain, Joint Pain, Joint Stiffness, Muscle Pain, Muscle Weakness and Swelling of Extremities. Neurological Not Present- Decreased Memory, Fainting, Headaches, Numbness, Seizures, Tingling, Tremor, Trouble walking and Weakness. Psychiatric Not Present- Anxiety, Bipolar, Change in Sleep Pattern, Depression, Fearful and Frequent crying. Endocrine Not Present- Cold Intolerance, Excessive Hunger, Hair Changes, Heat Intolerance, Hot flashes and New Diabetes. Hematology Not Present- Easy Bruising, Excessive bleeding, Gland problems, HIV and Persistent Infections.   Vitals (Alisha Spillers CMA; 06/15/2015 4:15 PM) 06/15/2015 4:14 PM Weight: 179 lb Height: 70in Body Surface Area: 2 m Body Mass Index: 25.68 kg/m Pulse: 86 (Regular)  BP: 104/62 (Sitting, Left Arm, Standard)  Physical Exam Rolm Bookbinder MD; 06/15/2015 4:43 PM) General Mental Status-Alert. Orientation-Oriented X3. cv rrr pulm clear bilaterally Abdomen Note: soft mildly tender ruq/epigastrium to deep palpation, no murphys sign, nondistended.  Laparoscopic cholecystectomy I do think given history this is likely her gb. I did tell her there is a chance this may not cure her pain though. will plan on lap chole I discussed the procedure in detail. The patient was given Neurosurgeon. We discussed the risks and benefits of a laparoscopic cholecystectomy and possible cholangiogram including, but not  limited to bleeding, infection, injury to surrounding structures such as the intestine or liver, bile leak, retained gallstones, need to convert to an open procedure, prolonged diarrhea, blood clots such as DVT, common bile duct injury, anesthesia  risks, and possible need for additional procedures. The likelihood of improvement in symptoms and return to the patient's normal status is good. We discussed the typical post-operative recovery course.

## 2015-08-31 NOTE — Discharge Instructions (Signed)
[facility] LAPAROSCOPIC SURGERY: POST OP INSTRUCTIONS  Always review your discharge instruction sheet given to you by the facility where your surgery was performed. IF YOU HAVE DISABILITY OR FAMILY LEAVE FORMS, YOU MUST BRING THEM TO THE OFFICE FOR PROCESSING.   DO NOT GIVE THEM TO YOUR DOCTOR.  1. A prescription for pain medication may be given to you upon discharge.  Take your pain medication as prescribed, if needed.  If narcotic pain medicine is not needed, then you may take acetaminophen (Tylenol), naprosyn (Alleve), or ibuprofen (Advil) as needed. 2. Take your usually prescribed medications unless otherwise directed. 3. If you need a refill on your pain medication, please contact your pharmacy.  They will contact our office to request authorization. Prescriptions will not be filled after 5pm or on week-ends. 4. You should follow a light diet the first few days after arrival home, such as soup and crackers, etc.  Be sure to include lots of fluids daily. 5. Most patients will experience some swelling and bruising in the area of the incisions.  Ice packs will help.  Swelling and bruising can take several days to resolve.  6. It is common to experience some constipation if taking pain medication after surgery.  Increasing fluid intake and taking a stool softener (such as Colace) will usually help or prevent this problem from occurring.  A mild laxative (Milk of Magnesia or Miralax) should be taken according to package instructions if there are no bowel movements after 48 hours. 7. Unless discharge instructions indicate otherwise, you may remove your bandages 48 hours after surgery, and you may shower at that time.  You may have steri-strips (small skin tapes) in place directly over the incision.  These strips should be left on the skin for 7-10 days.  If your surgeon used skin glue on the incision, you may shower in 24 hours.  The glue will flake  off over the next 2-3 weeks.  Any sutures or staples will be removed at the office during your follow-up visit. 8. ACTIVITIES:  You may resume regular (light) daily activities beginning the next day--such as daily self-care, walking, climbing stairs--gradually increasing activities as tolerated.  You may have sexual intercourse when it is comfortable.  Refrain from any heavy lifting or straining until approved by your doctor. a. You may drive when you are no longer taking prescription pain medication, you can comfortably wear a seatbelt, and you can safely maneuver your car and apply brakes. b. RETURN TO WORK:  __________________________________________________________ 9. You should see your doctor in the office for a follow-up appointment approximately 2-3 weeks after your surgery.  Make sure that you call for this appointment within a day or two after you arrive home to insure a convenient appointment time. 10. OTHER INSTRUCTIONS: __________________________________________________________________________________________________________________________ __________________________________________________________________________________________________________________________ WHEN TO CALL YOUR DOCTOR: 1. Fever over 101.0 2. Inability to urinate 3. Continued bleeding from incision. 4. Increased pain, redness, or drainage from the incision. 5. Increasing abdominal pain  The clinic staff is available to answer your questions during regular business hours.  Please don't hesitate to call and ask to speak to one of the nurses for clinical concerns.  If you have a medical emergency, go to the nearest emergency room or call 911.  A surgeon from [facility] is always on call at the hospital. [address], [address], [place], [place]  [address] ? [address] [address], , [place]   [address] [phone] ? [phone] ? [phone]   [phone] Web site: [email or web address]  

## 2015-08-31 NOTE — Anesthesia Procedure Notes (Signed)
Procedure Name: Intubation Date/Time: 08/31/2015 1:22 PM Performed by: Rogers Blocker Pre-anesthesia Checklist: Timeout performed, Patient identified, Emergency Drugs available, Suction available and Patient being monitored Patient Re-evaluated:Patient Re-evaluated prior to inductionOxygen Delivery Method: Circle system utilized Preoxygenation: Pre-oxygenation with 100% oxygen Intubation Type: IV induction Ventilation: Mask ventilation without difficulty Laryngoscope Size: Mac and 3 Grade View: Grade I Tube type: Oral Tube size: 7.0 mm Number of attempts: 1 Airway Equipment and Method: Stylet Placement Confirmation: ETT inserted through vocal cords under direct vision,  breath sounds checked- equal and bilateral,  positive ETCO2 and CO2 detector Secured at: 21 cm Tube secured with: Tape Dental Injury: Teeth and Oropharynx as per pre-operative assessment

## 2015-08-31 NOTE — Interval H&P Note (Signed)
History and Physical Interval Note:  08/31/2015 11:18 AM  Crystal Owens  has presented today for surgery, with the diagnosis of BILIARY DYSKINESIA  The various methods of treatment have been discussed with the patient and family. After consideration of risks, benefits and other options for treatment, the patient has consented to  Procedure(s): LAPAROSCOPIC CHOLECYSTECTOMY (N/A) as a surgical intervention .  The patient's history has been reviewed, patient examined, no change in status, stable for surgery.  I have reviewed the patient's chart and labs.  Questions were answered to the patient's satisfaction.     Roel Douthat

## 2015-09-01 ENCOUNTER — Encounter (HOSPITAL_COMMUNITY): Payer: Self-pay | Admitting: General Surgery

## 2016-02-02 ENCOUNTER — Other Ambulatory Visit: Payer: Self-pay | Admitting: Orthopedic Surgery

## 2017-05-18 ENCOUNTER — Emergency Department (HOSPITAL_COMMUNITY)
Admission: EM | Admit: 2017-05-18 | Discharge: 2017-05-18 | Disposition: A | Discharge status: Home / Self Care | Payer: Medicaid Other | Attending: Emergency Medicine | Admitting: Emergency Medicine

## 2017-05-18 ENCOUNTER — Emergency Department (HOSPITAL_COMMUNITY): Payer: Medicaid Other

## 2017-05-18 ENCOUNTER — Encounter (HOSPITAL_COMMUNITY): Payer: Self-pay

## 2017-05-18 DIAGNOSIS — Y929 Unspecified place or not applicable: Secondary | ICD-10-CM | POA: Diagnosis not present

## 2017-05-18 DIAGNOSIS — Z79899 Other long term (current) drug therapy: Secondary | ICD-10-CM | POA: Diagnosis not present

## 2017-05-18 DIAGNOSIS — Y939 Activity, unspecified: Secondary | ICD-10-CM | POA: Insufficient documentation

## 2017-05-18 DIAGNOSIS — F17210 Nicotine dependence, cigarettes, uncomplicated: Secondary | ICD-10-CM | POA: Insufficient documentation

## 2017-05-18 DIAGNOSIS — Y999 Unspecified external cause status: Secondary | ICD-10-CM | POA: Insufficient documentation

## 2017-05-18 DIAGNOSIS — M546 Pain in thoracic spine: Secondary | ICD-10-CM | POA: Diagnosis not present

## 2017-05-18 DIAGNOSIS — T07XXXA Unspecified multiple injuries, initial encounter: Secondary | ICD-10-CM

## 2017-05-18 DIAGNOSIS — S24109A Unspecified injury at unspecified level of thoracic spinal cord, initial encounter: Secondary | ICD-10-CM | POA: Diagnosis present

## 2017-05-18 LAB — I-STAT BETA HCG BLOOD, ED (MC, WL, AP ONLY)

## 2017-05-18 MED ORDER — OXYCODONE-ACETAMINOPHEN 5-325 MG PO TABS
1.0000 | ORAL_TABLET | Freq: Once | ORAL | Status: AC
  Administered 2017-05-18: 1 via ORAL
  Filled 2017-05-18: qty 1

## 2017-05-18 MED ORDER — KETOROLAC TROMETHAMINE 60 MG/2ML IM SOLN
60.0000 mg | Freq: Once | INTRAMUSCULAR | Status: AC
  Administered 2017-05-18: 60 mg via INTRAMUSCULAR
  Filled 2017-05-18: qty 2

## 2017-05-18 NOTE — ED Notes (Signed)
Patient transported to X-ray 
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
## 2017-06-03 ENCOUNTER — Emergency Department (HOSPITAL_COMMUNITY): Payer: Medicaid Other

## 2017-06-03 ENCOUNTER — Encounter (HOSPITAL_COMMUNITY): Payer: Self-pay | Admitting: Emergency Medicine

## 2017-06-03 ENCOUNTER — Emergency Department (HOSPITAL_COMMUNITY)
Admission: EM | Admit: 2017-06-03 | Discharge: 2017-06-03 | Disposition: A | Discharge status: Home / Self Care | Payer: Medicaid Other | Attending: Emergency Medicine | Admitting: Emergency Medicine

## 2017-06-03 DIAGNOSIS — Y929 Unspecified place or not applicable: Secondary | ICD-10-CM | POA: Diagnosis not present

## 2017-06-03 DIAGNOSIS — M25551 Pain in right hip: Secondary | ICD-10-CM | POA: Insufficient documentation

## 2017-06-03 DIAGNOSIS — Y999 Unspecified external cause status: Secondary | ICD-10-CM | POA: Insufficient documentation

## 2017-06-03 DIAGNOSIS — Y939 Activity, unspecified: Secondary | ICD-10-CM | POA: Insufficient documentation

## 2017-06-03 DIAGNOSIS — F17210 Nicotine dependence, cigarettes, uncomplicated: Secondary | ICD-10-CM | POA: Insufficient documentation

## 2017-06-03 DIAGNOSIS — S00469A Insect bite (nonvenomous) of unspecified ear, initial encounter: Secondary | ICD-10-CM | POA: Diagnosis not present

## 2017-06-03 DIAGNOSIS — S90869A Insect bite (nonvenomous), unspecified foot, initial encounter: Secondary | ICD-10-CM | POA: Diagnosis not present

## 2017-06-03 DIAGNOSIS — W57XXXA Bitten or stung by nonvenomous insect and other nonvenomous arthropods, initial encounter: Secondary | ICD-10-CM | POA: Diagnosis not present

## 2017-06-03 LAB — URINALYSIS, ROUTINE W REFLEX MICROSCOPIC
BACTERIA UA: NONE SEEN
Bilirubin Urine: NEGATIVE
Glucose, UA: NEGATIVE mg/dL
HGB URINE DIPSTICK: NEGATIVE
Ketones, ur: NEGATIVE mg/dL
NITRITE: NEGATIVE
PROTEIN: NEGATIVE mg/dL
Specific Gravity, Urine: 1.019 (ref 1.005–1.030)
pH: 7 (ref 5.0–8.0)

## 2017-06-03 LAB — POC URINE PREG, ED: Preg Test, Ur: NEGATIVE

## 2017-06-03 MED ORDER — LIDOCAINE 5 % EX PTCH: 1.0000 | MEDICATED_PATCH | CUTANEOUS | 0 refills | Status: AC

## 2017-06-03 MED ORDER — HYDROCODONE-ACETAMINOPHEN 5-325 MG PO TABS: 1.0000 | ORAL_TABLET | Freq: Four times a day (QID) | ORAL | 0 refills | Status: AC | PRN

## 2017-06-03 MED ORDER — IBUPROFEN 600 MG PO TABS: 600.0000 mg | ORAL_TABLET | Freq: Four times a day (QID) | ORAL | 0 refills | Status: DC | PRN

## 2017-06-03 MED ORDER — DOXYCYCLINE HYCLATE 100 MG PO TABS
200.0000 mg | ORAL_TABLET | Freq: Once | ORAL | Status: AC
  Administered 2017-06-03: 200 mg via ORAL
  Filled 2017-06-03: qty 2

## 2017-06-03 MED ORDER — METHOCARBAMOL 500 MG PO TABS: 500.0000 mg | ORAL_TABLET | Freq: Two times a day (BID) | ORAL | 0 refills | Status: AC

## 2017-06-03 MED ORDER — KETOROLAC TROMETHAMINE 60 MG/2ML IM SOLN
60.0000 mg | Freq: Once | INTRAMUSCULAR | Status: AC
  Administered 2017-06-03: 60 mg via INTRAMUSCULAR
  Filled 2017-06-03: qty 2

## 2017-06-03 NOTE — Discharge Instructions (Signed)
Expect your soreness to increase over the next 2-3 days. Take it easy, but do not lay around too much as this may make any stiffness worse.  Antiinflammatory medications: Take 600 mg of ibuprofen every 6 hours or 440 mg (over the counter dose) to 500 mg (prescription dose) of naproxen every 12 hours or for the next 3 days. After this time, these medications may be used as needed for pain. Take these medications with food to avoid upset stomach. Choose only one of these medications, do not take them together.  Vicodin for severe pain. Do not drive or perform other dangerous activities while taking the Vicodin.  Muscle relaxer: Robaxin is a muscle relaxer and may help loosen stiff muscles. Do not take the Robaxin while driving or performing other dangerous activities.   Lidocaine patches: These are available via either prescription or over-the-counter. The over-the-counter option may be more economical one and are likely just as effective. There are multiple over-the-counter brands, such as Salonpas.  Exercises: Be sure to perform the attached exercises starting with three times a week and working up to performing them daily. This is an essential part of preventing long term problems.   Follow-up with the orthopedic specialist as soon as possible for further management of the hip pain. Call the number provided to set up an appointment. Follow up with a primary care provider as soon as possible for any further management of the tick bite.  Return to the ED initially symptoms worsen.
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
## 2018-02-04 ENCOUNTER — Ambulatory Visit
Admission: RE | Admit: 2018-02-04 | Discharge: 2018-02-04 | Disposition: A | Discharge status: Home / Self Care | Payer: Medicaid Other | Source: Ambulatory Visit | Attending: Family Medicine | Admitting: Family Medicine

## 2018-02-04 ENCOUNTER — Other Ambulatory Visit: Payer: Self-pay | Admitting: Family Medicine

## 2018-02-04 DIAGNOSIS — M545 Low back pain: Secondary | ICD-10-CM

## 2018-02-04 DIAGNOSIS — M542 Cervicalgia: Secondary | ICD-10-CM | POA: Diagnosis present

## 2018-05-20 ENCOUNTER — Other Ambulatory Visit: Payer: Self-pay | Admitting: Podiatry

## 2018-05-20 ENCOUNTER — Ambulatory Visit: Payer: Medicaid Other | Admitting: Podiatry

## 2018-05-20 ENCOUNTER — Encounter: Payer: Self-pay | Admitting: Podiatry

## 2018-05-20 VITALS — BP 118/85 | HR 96

## 2018-05-20 DIAGNOSIS — M216X9 Other acquired deformities of unspecified foot: Secondary | ICD-10-CM

## 2018-05-20 DIAGNOSIS — L600 Ingrowing nail: Secondary | ICD-10-CM | POA: Diagnosis not present

## 2018-05-20 MED ORDER — TRAMADOL HCL 50 MG PO TABS
50.0000 mg | ORAL_TABLET | Freq: Two times a day (BID) | ORAL | 0 refills | Status: AC | PRN
Start: 2018-05-20 — End: ?

## 2018-05-20 NOTE — Progress Notes (Signed)
Patient called at 6:45pm states that she had ingrown toenails performed on both of her big toes.  She states that she has tried ibuprofen, Aleve and she is continue to have a lot of pain to her toes and she called very upset.  She states that she can feel a heartbeat in the throbbing pain to her toes.  I did call in some tramadol for her.  Discussed ice and elevation.  She started a lot of throbbing she can remove the bandages and start soaking tonight if needed.  Otherwise with the bandages on until tomorrow and start post procedure instructions tomorrow.  Otherwise she is doing well other than the pain.  Encouraged to call back with any questions or concerns any changes.  She verbalized understanding had no further questions tonight.  Trula Slade

## 2018-05-20 NOTE — Patient Instructions (Signed)

## 2018-05-22 NOTE — Progress Notes (Signed)
Subjective:   Patient ID: Crystal Owens, female   DOB: 27 y.o.   MRN: [id number]   HPI Patient presents with extreme ingrown toenail deformities of the medial border of the big toe both feet and the lateral border and also has a lesion plantar right that is painful when pressed and states that she does get callus formation.  Patient smokes three-quarter pack per day and likes to be active   Review of Systems  All other systems reviewed and are negative.       Objective:  Physical Exam  Constitutional: She appears well-developed and well-nourished.  Cardiovascular: Intact distal pulses.  Pulmonary/Chest: Effort normal.  Musculoskeletal: Normal range of motion.  Neurological: She is alert.  Skin: Skin is warm.  Nursing note and vitals reviewed.   Neurovascular status intact muscle strength is adequate range of motion within normal limits with patient noted to have incurvated hallux medial lateral borders that are very painful when pressed and make shoe gear difficult.  She is overall damage to the nailbeds but the pain so far is only in the nail quarters even though the nails themselves are not healthy.  Patient has good digital perfusion well oriented x3     Assessment:  Ingrown toenail deformity hallux bilateral with pain and discomfort     Plan:  H&P conditions reviewed and recommended removal of the nail borders explained procedure and risk.  Patient wants surgery and today I explained the procedures and risks associated with them and I infiltrated each hallux 60 mg like Marcaine mixture remove the borders exposed matrix and applied phenol 3 applications 30 seconds followed by alcohol lavage sterile dressing.  Gave instructions on soaks reappoint and I first did explain the risk and this was all done under sterile conditions with sterile instrumentation.  Debrided lesions plantar aspect both feet which was tolerated well with no iatrogenic bleeding

## 2018-05-25 ENCOUNTER — Encounter: Payer: Self-pay | Admitting: Podiatry

## 2018-05-25 ENCOUNTER — Ambulatory Visit (INDEPENDENT_AMBULATORY_CARE_PROVIDER_SITE_OTHER): Payer: Medicaid Other | Admitting: Podiatry

## 2018-05-25 VITALS — Temp 99.3°F

## 2018-05-25 DIAGNOSIS — Z9889 Other specified postprocedural states: Secondary | ICD-10-CM

## 2018-05-25 DIAGNOSIS — L600 Ingrowing nail: Secondary | ICD-10-CM

## 2018-05-25 DIAGNOSIS — T148XXA Other injury of unspecified body region, initial encounter: Secondary | ICD-10-CM

## 2018-05-25 MED ORDER — CEPHALEXIN 500 MG PO CAPS: 500.0000 mg | ORAL_CAPSULE | Freq: Three times a day (TID) | ORAL | 0 refills | Status: AC

## 2018-05-25 NOTE — Progress Notes (Signed)
Subjective: 27 year old female presents the office today for concerns of a blister to the right big toe.  She did call me overnight for concerns of pain and blister to the bottom of the right big toe.  She previously underwent a partial nail avulsion with chemical matricectomy on Monday with Dr. Paulla Dolly on both sides the left third toe and the medial aspect the right hallux.  She states that she can soak in Epsom salts and she is been covering with a gauze but not with antibiotic ointment or any other ointments.  She denies any drainage or pus and denies any red streaks.  Denies any systemic complaints such as fevers, chills, nausea, vomiting. No acute changes since last appointment, and no other complaints at this time.   Objective: AAO x3, NAD DP/PT pulses palpable bilaterally, CRT less than 3 seconds Status post partial nail avulsion to her bilateral hallux.  Also is done well there is granulation tissue present and starting to scab over.  On the right side there is still granulation present with the nail bed however there is a blister present on the plantar aspect of the hallux.  I did drain is only clear fluid came out and there is no pus.  There is very minimal edema there is minimal erythema she will feel the nail corner but there is no ascending cellulitis.  There is no fluctuation or crepitation otherwise there is no malodor.   No open lesions or pre-ulcerative lesions.  No pain with calf compression, swelling, warmth, erythema  Assessment: Status post bilateral partial nail avulsion with chemical matricectomy with blister right hallux  Plan: -All treatment options discussed with the patient including all alternatives, risks, complications.  -I did drain the blister today and revealed only small amount of clear drainage.  There is no pus.  Recommend Epson salt soaks daily covered with antibiotic ointment and a bandage.  Will start Keflex. -Monitor for any clinical signs or symptoms of  infection and directed to call the office immediately should any occur or go to the ER. -Follow-up in 2 weeks if needed or sooner if any issues are to arise. -Patient encouraged to call the office with any questions, concerns, change in symptoms.   Trula Slade DPM

## 2018-05-25 NOTE — Patient Instructions (Signed)

## 2018-06-07 ENCOUNTER — Ambulatory Visit: Payer: Medicaid Other | Admitting: Podiatry

## 2018-06-07 ENCOUNTER — Encounter: Payer: Self-pay | Admitting: Podiatry

## 2018-06-07 DIAGNOSIS — L03032 Cellulitis of left toe: Secondary | ICD-10-CM

## 2018-06-07 MED ORDER — CEPHALEXIN 500 MG PO CAPS: 500.0000 mg | ORAL_CAPSULE | Freq: Three times a day (TID) | ORAL | 1 refills | Status: AC

## 2018-06-07 NOTE — Progress Notes (Signed)
Subjective:   Patient ID: Crystal Owens, female   DOB: 27 y.o.   MRN: [id number]   HPI Patient presents stating I am still getting some discomfort in my left big toenail on the lateral side and I still have some drainage after a while.  States it some better but still present   ROS      Objective:  Physical Exam  Neurovascular status intact with patient found to have crusted tissue left hallux lateral border but localized with slight redness but no proximal edema erythema or drainage noted.  It is localized to this area and I did not note any other pathology with mild discomfort when I palpated deeply     Assessment:  Possibility for paronychia infection left hallux lateral border that appears to be localized and is making gradual improvement but is not completely corrected currently     Plan:  At this point we will try to add some white vinegar and see if that will help as far as soaks goes and I advised him but he does appear dry as much as possible.  It does appear to heal uneventfully in the next few weeks but as precautionary measure I did place her back on cephalexin 500 mg 3 times daily as she tolerated it well and I gave her strict instructions of any further redness should occur any proximal signs of infection she is to reappoint immediately and if it does not get better in the next several weeks she will reappoint and will have to open that area up and clean out the tissue.  Patient understands this as this her mother and they are encouraged to call with any questions

## 2019-01-02 ENCOUNTER — Ambulatory Visit
Admission: RE | Admit: 2019-01-02 | Discharge: 2019-01-02 | Disposition: A | Discharge status: Home / Self Care | Payer: Medicaid Other | Source: Ambulatory Visit | Attending: Adult Health | Admitting: Adult Health

## 2019-01-02 ENCOUNTER — Other Ambulatory Visit: Payer: Self-pay | Admitting: Adult Health

## 2019-01-02 DIAGNOSIS — M79671 Pain in right foot: Secondary | ICD-10-CM

## 2019-05-27 IMAGING — CR DG LUMBAR SPINE 2-3V
2 series · 3 of 3 positions shown · non-contrast
Comparison: Radiographs October 03, 2014.

CLINICAL DATA: Acute low back pain after fall several days ago.

EXAM:
LUMBAR SPINE - 2-3 VIEW

[l-spine ap]
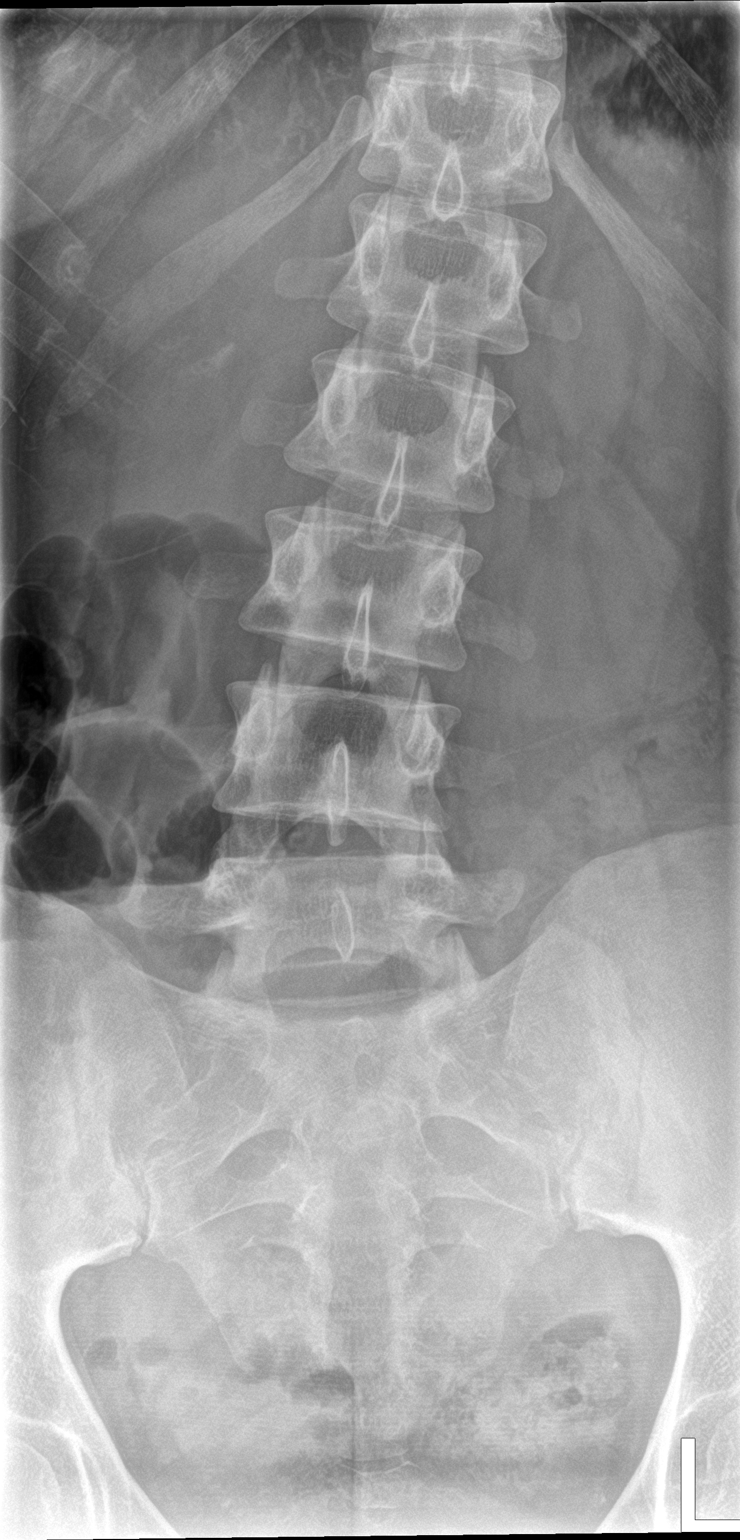

[Series 2: l-spine lat · 0.14mm/px · 2 of 2 slices shown]
[im 1/2]
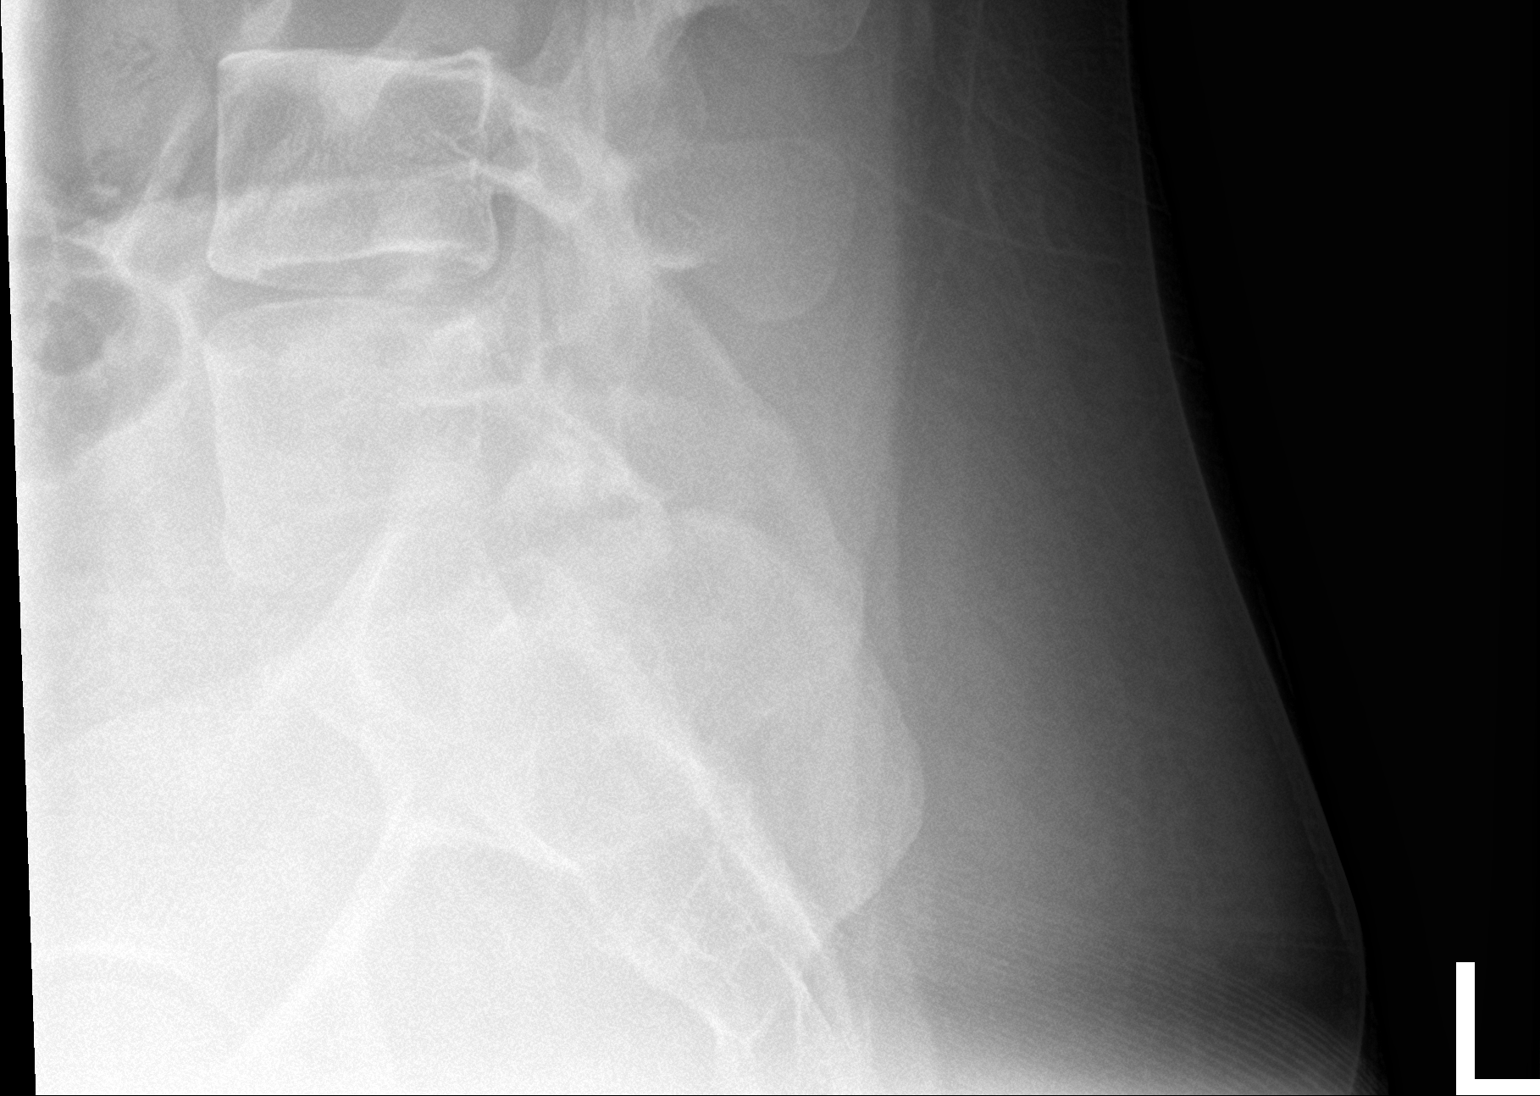
[im 2/2]
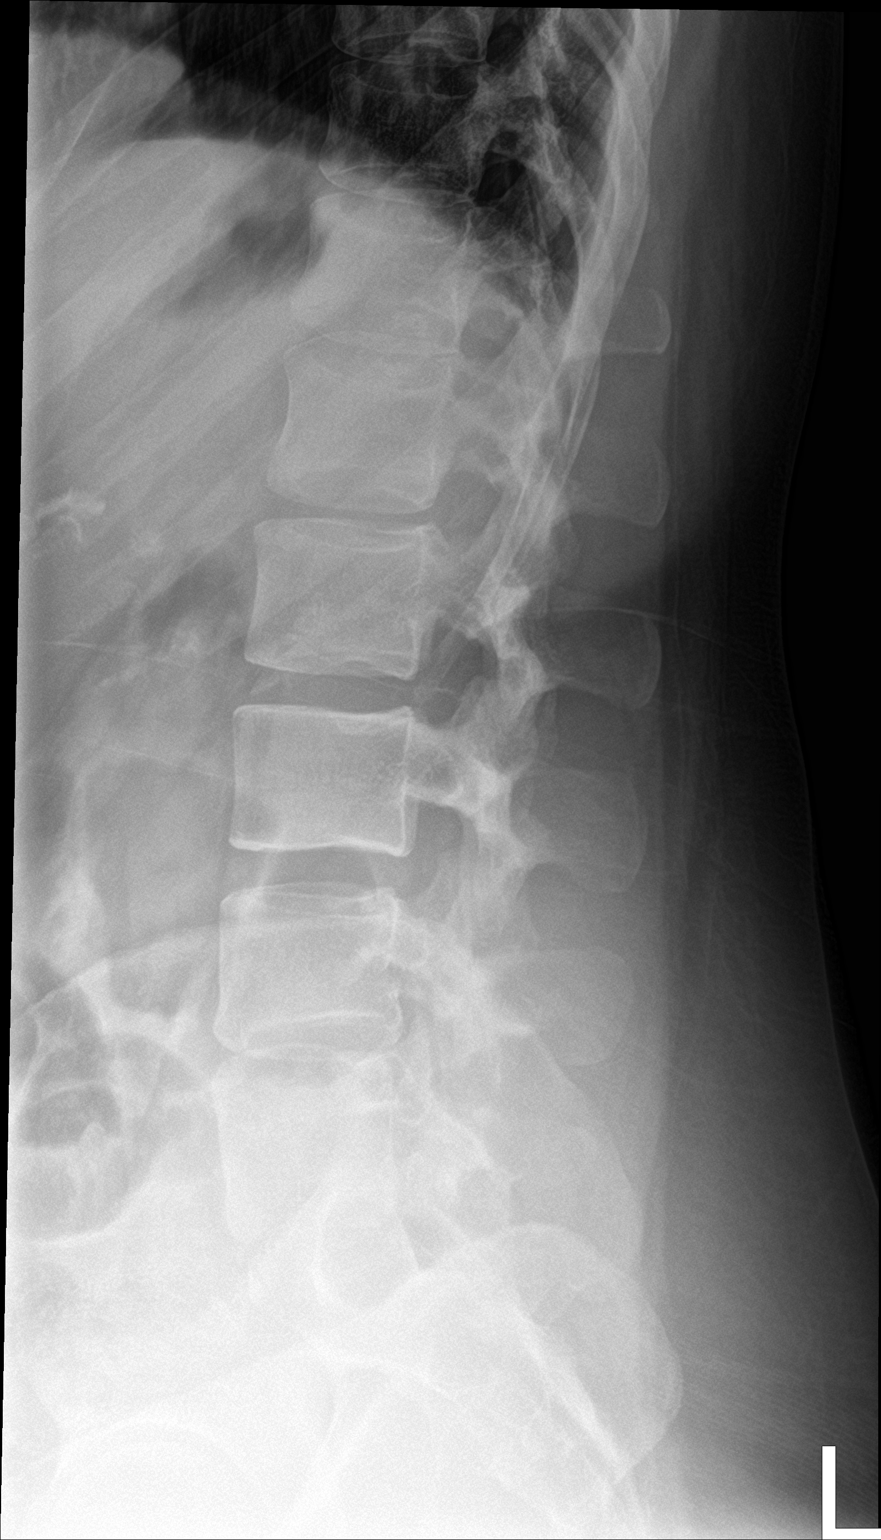

[3 of 3 positions shown; findings below may reference images not displayed]

FINDINGS: There is no evidence of lumbar spine fracture. Alignment is normal.
Intervertebral disc spaces are maintained.
IMPRESSION: Normal lumbar spine.

## 2020-02-18 ENCOUNTER — Ambulatory Visit: Payer: Medicaid Other | Attending: Internal Medicine

## 2020-04-08 ENCOUNTER — Ambulatory Visit: Payer: Medicaid Other | Admitting: Dermatology

## 2020-04-23 IMAGING — CR DG FOOT COMPLETE 3+V*R*
3 series · 3 of 3 positions shown · non-contrast
Comparison: None.

CLINICAL DATA: Right foot injury. Pain involving the third through
fifth toes. Initial encounter.

EXAM:
RIGHT FOOT COMPLETE - 3+ VIEW

[foot ap]
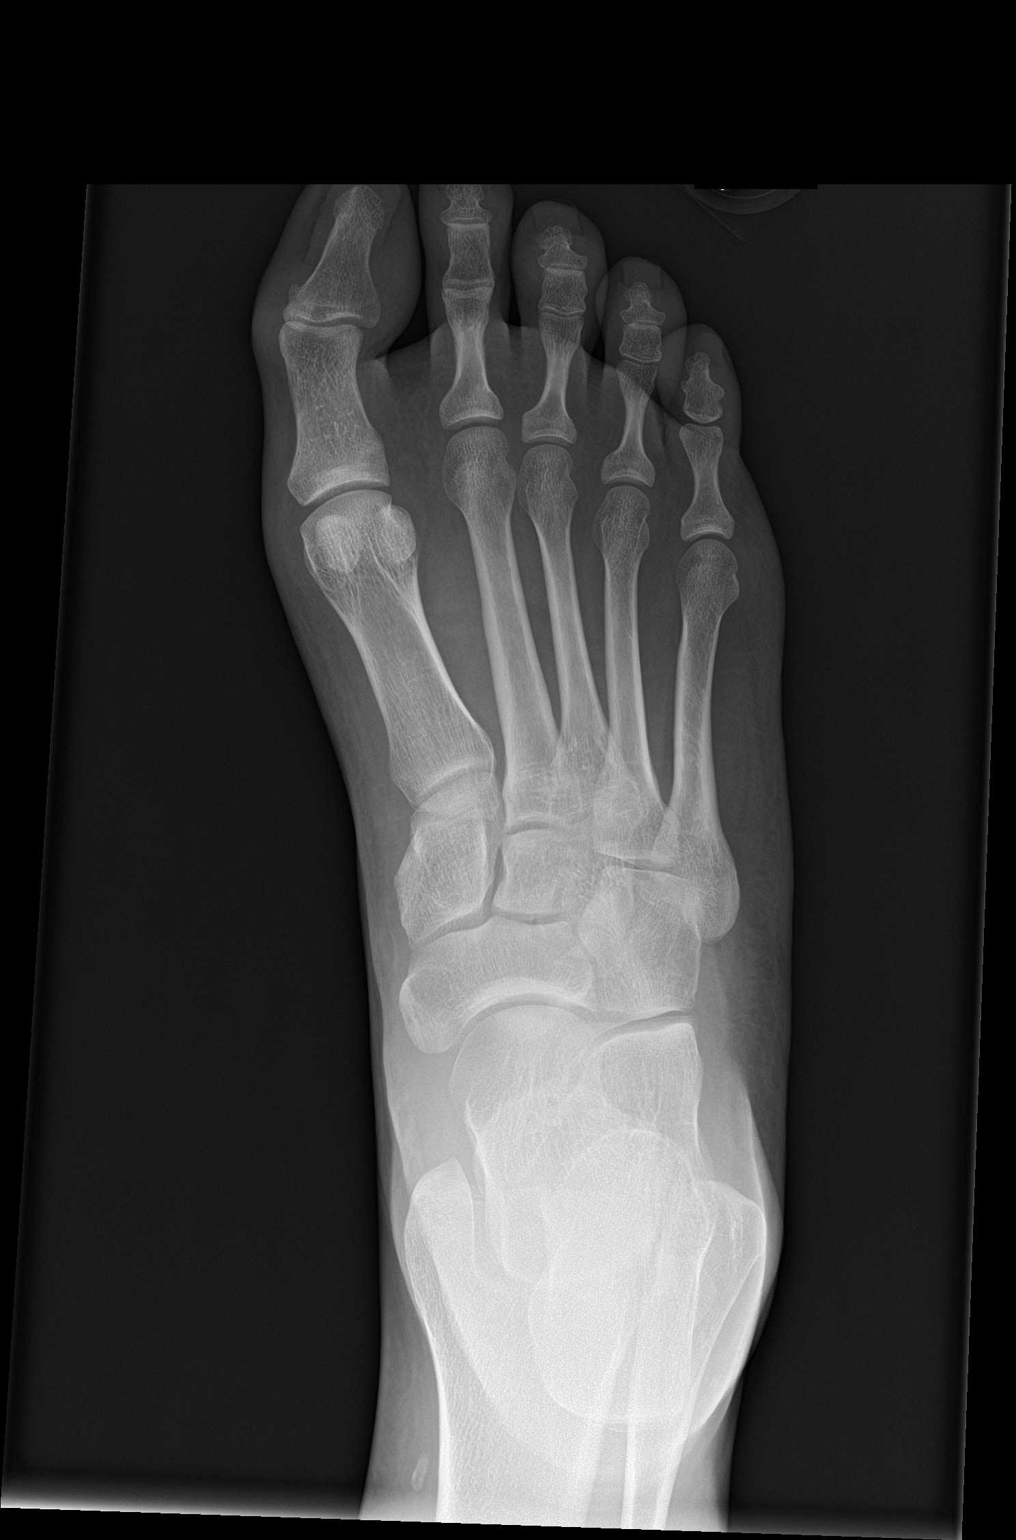

[foot obl]
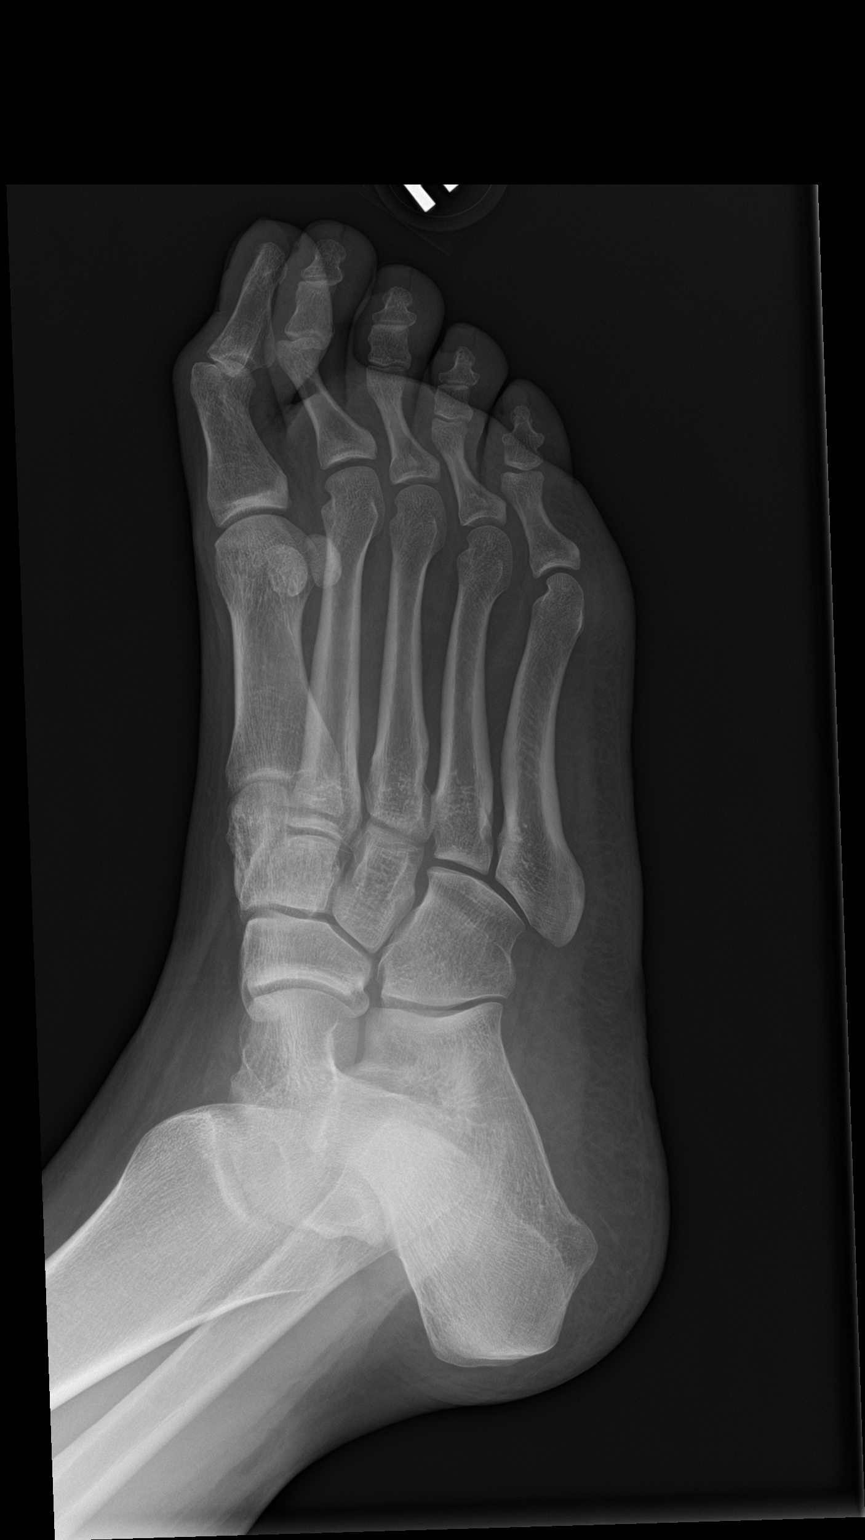

[foot lat]
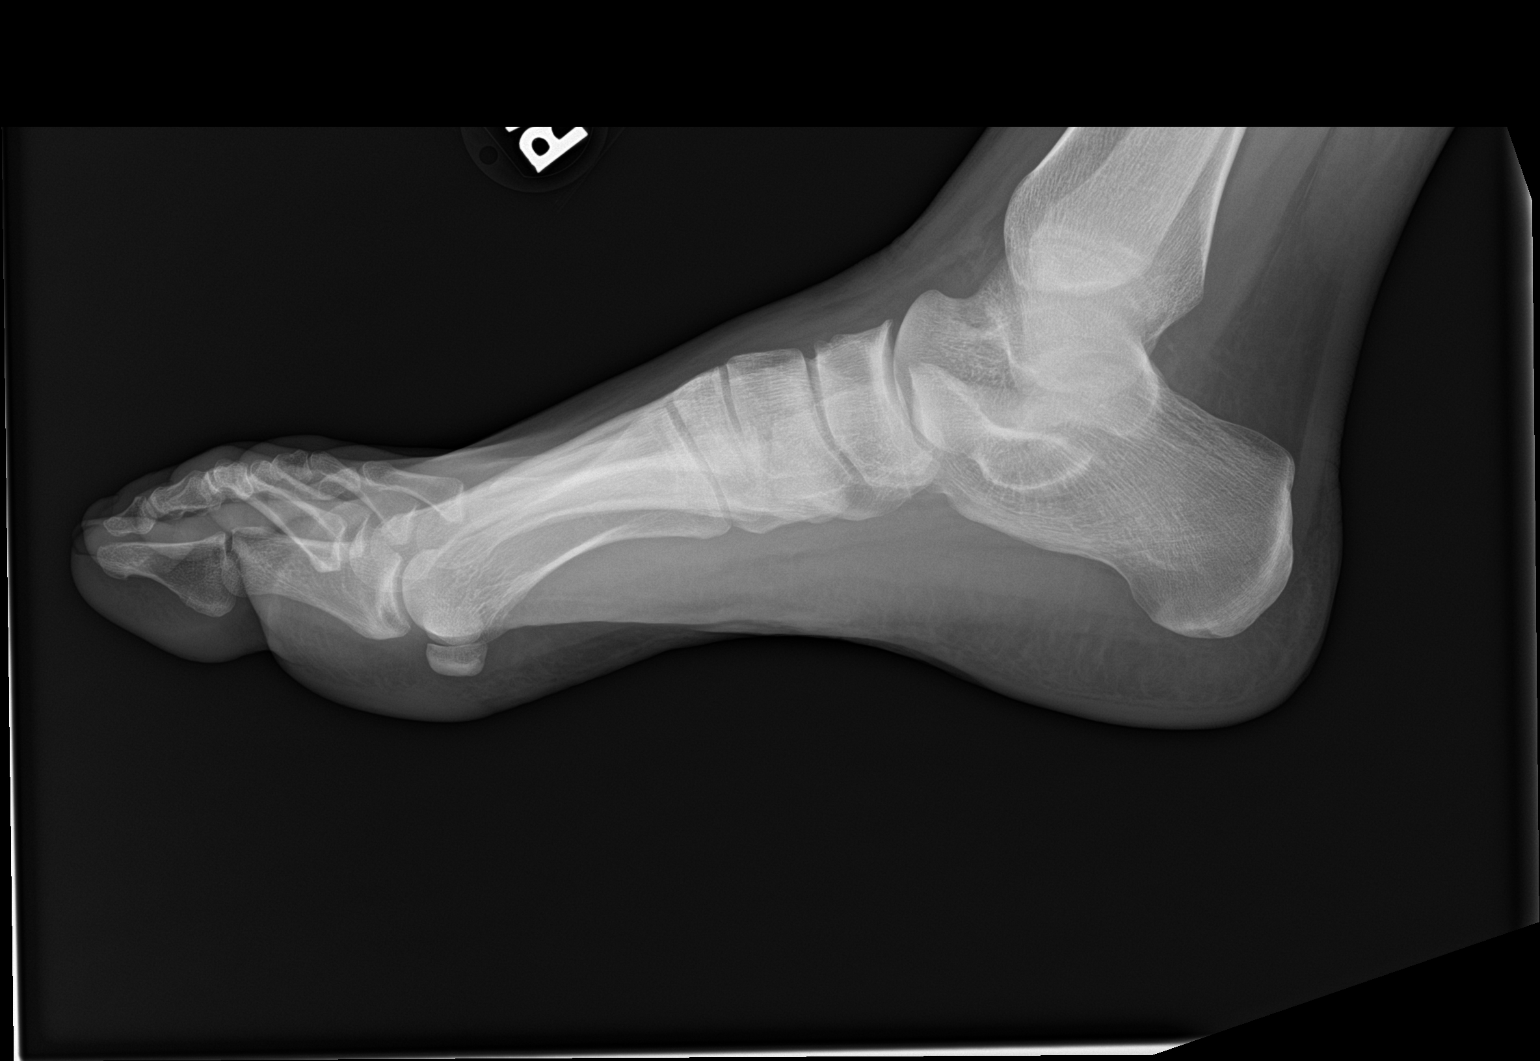

[3 of 3 positions shown; findings below may reference images not displayed]

FINDINGS: There is no evidence of fracture or dislocation. There is no
evidence of arthropathy or other focal bone abnormality. Soft
tissues are unremarkable.
IMPRESSION: Negative.

## 2020-06-29 ENCOUNTER — Other Ambulatory Visit: Payer: Self-pay

## 2020-06-29 ENCOUNTER — Ambulatory Visit (INDEPENDENT_AMBULATORY_CARE_PROVIDER_SITE_OTHER): Payer: Medicaid Other | Admitting: Dermatology

## 2020-06-29 DIAGNOSIS — L209 Atopic dermatitis, unspecified: Secondary | ICD-10-CM | POA: Diagnosis not present

## 2020-06-29 DIAGNOSIS — D229 Melanocytic nevi, unspecified: Secondary | ICD-10-CM

## 2020-06-29 DIAGNOSIS — L74512 Primary focal hyperhidrosis, palms: Secondary | ICD-10-CM

## 2020-06-29 DIAGNOSIS — L74519 Primary focal hyperhidrosis, unspecified: Secondary | ICD-10-CM

## 2020-06-29 DIAGNOSIS — Q825 Congenital non-neoplastic nevus: Secondary | ICD-10-CM

## 2020-06-29 DIAGNOSIS — L74513 Primary focal hyperhidrosis, soles: Secondary | ICD-10-CM

## 2020-06-29 DIAGNOSIS — L74510 Primary focal hyperhidrosis, axilla: Secondary | ICD-10-CM

## 2020-06-29 DIAGNOSIS — L301 Dyshidrosis [pompholyx]: Secondary | ICD-10-CM | POA: Diagnosis not present

## 2020-06-29 MED ORDER — MOMETASONE FUROATE 0.1 % EX CREA: TOPICAL_CREAM | CUTANEOUS | 1 refills | Status: AC

## 2020-06-29 NOTE — Patient Instructions (Addendum)
Atopic Dermatitis  "Dermatitis" means inflammation of the skin.  "Atopic" dermatitis is a particular type of skin inflammation that is marked by dryness, associated itching, and a characteristic pattern of rash on the body.  The condition is fairly common and may occur in as many as 10% of children.  You will often hear it called "atopic eczema" or sometimes just "eczema".  The exact cause of atopic dermatitis is unknown.  In many patients, there is a family history of hay fever, asthma, or atopic dermatitis itself.  Rarely, atopic dermatitis in infants may be related to food sensitivity, such as sensitivity to milk, but this is often difficult to determine and manage.  In the majority of cases, however, no allergic triggers can be found.  Physical or emotional stressors (severe seasonal allergies, physical illness, etc.) can worsen atopic dermatitis.  Atopic dermatitis usually starts in infancy from the ages of 2 to 6 months.  The skin is dry and the rash is quite itchy, so infants may be restless and rub against the sheets or scratch (if able).  The rash may involve the face or it may cover a large part of the body.  As the child gets older, the rash may become more localized.  In early childhood, the rash is commonly on the legs, feet, hands and arms.  As a child becomes older, the rash may be limited to the bend of the elbows, knees, on the back of the hands, feet, and on the neck and face.  When the rash becomes more established, the dry itchy skin may become thickened, leathery and sometimes darker in coloration.  The more the person scratches, the worse the rash is and the thicker the skin gets.  Many children with atopic dermatitis outgrow the condition before school age, while others continue to have problems into adolescence and adulthood.  Many things may affect the severity of the condition.  All patients have sensitive and dry skin.  Many will find that during the winter months when the  humidity is very low, the dryness and itchiness will be worse.  On the other hand, some people are easily irritated by sweat and will find that they have more problems during the summer months.  Most patients note an increase in itching at times when there are sudden changes in temperature.  Other irritants easily affect the skin of a patient with atopic dermatitis.  Use of harsh soaps or detergents and exposure to wool are common problems.  Sometimes atopic dermatitis may become infected by bacteria, yeast or viruses.  This is called "secondary infection".  Bacterial secondary infection is the most common and is often a result of scratching.  The rash gets very red with pus-filled pimples and scabs.  If this occurs, your doctor will prescribe an antibiotic to control the infection.  A more serious complication can be caused by certain viruses.  The "cold sore" virus (herpes simplex) may cause a severe rash.  If this is suspected, immediately contact your doctor.   What can I expect from treatment? Unfortunately, there is no "magic" cure that will always eliminate atopic dermatitis.  The main objective in treating atopic dermatitis is to decrease the skin eruption and relieve the itching.  There are a number of different forms of the medications that are used for atopic dermatitis.  Primarily, topical medications will be used.  Because the skin is excessively dry, moisturizers will be recommended that will effectively decrease the dryness.  Daily bathing is  a useful way to get water into the skin but bathing should be brief (no more than 10 minutes unless otherwise indicated by your physician).  Effective moisturizers (Cetaphil cream or lotion, CeraVe cream or lotion [[employer], [employer], and [employer]], Aquaphor, and plain Vaseline) can be used immediately after the bath or shower to trap moisture within the skin.  It is best to "pat dry" after a bathing and then place your moisturizer (cream or lotion) on your  skin.  Cortisone (steroid) is a medicated ointment or cream (eg. triamcinolone, hydrocortisone, desonide, betamethasone, clobetasol) that may also be suggested.  It is very helpful in decreasing the itching and controlling the inflammation.  Your doctor will prescribe a cortisone treatment that is most appropriate for the severity and location of the dermatitis that is to be treated.   Once the affected area clears up, it is best to discontinue the use of the cortisone preparation due to possibility of atrophy (skin thinning), but continue the regular use of moisturizers to try to prevent new areas of dermatitis from occurring.  Of course, if itching or a new rash begins, the cortisone preparation may have to be started again.  Anti-inflammatory creams and ointments which are not steroids such as Protopic and Elidel may also be prescribed.  Certain internal medicines called antihistamines (eg. Atarax, Benadryl, hydroxyzine) may help control itching.  They primarily help with the itching by introducing some drowsiness and allowing you to sleep at night.  Some oral antibiotics are often useful as well for controlling the secondary infection and enable infected dermatitis to be controlled.  Other important forms of treatment: 1. Avoid contact with substances you know to cause itching.  These may include soaps, detergents, certain perfumes, dust, grass, weeds, wools, and other types of scratchy clothing. 2. You may bathe daily.  Use no soap or the minimal amount necessary to get clean.  Always use moisturizer immediately after bathing (within 3 minutes is best).  Avoid very hot or very cold water.  Avoid bubble baths.  When drying with a towel, pat dry and do not rub. Use a mild, unscented soap (Dove, CeraVe Cleanser, Lever 2000, or Cetaphil). 3. Try to keep the temperature and humidity in the home fairly constant.  Use a bedroom air conditioner in the summer and a humidifier in the winter.  It is very  important that the humidifier be cleaned frequently and thoroughly since mold may grow and cause allergies. 4. Try to avoid scratching.  Atopic dermatitis is often called "the itch that rashes" and it is known that scratching plays a significant role in making atopic dermatitis worse.  Keeping the nails short and well-filed is helpful. 5. Use a fragrance-free, sensitive skin laundry detergent (eg. All Free & Clear).  Run clothes through a second rinse cycle to remove any residual detergents and chemicals.  Bed linens and towels should be washed in hot water to kill dust mites, which are common allergen in atopic patients. 6. In the bedroom, minimize rugs and curtains or other loose fabrics that collect dust.  The National Eczema Association (www.eczema-assn.org) is a wonderful organization that sends out a Mudlogger with useful information on these types of conditions. Please consider contacting them at the above website or by address: National Eczema [employer] for Science and Education, [place], [place], [place], [place]      Recommend mild soap and routine use of moisturizing cream after handwashing.  Minimize soap/water exposure when possible.

## 2020-06-29 NOTE — Progress Notes (Signed)
   New Patient Visit  Subjective  Crystal Owens is a 29 y.o. female who presents for the following: rash (Neck off and on x several months. Itchy. Flares more when in sun. Tried topical Lotrimin and oral fluconazole with no improvement.) and blisters (Hands, wrists off and on x 6 mos. Itchy.).    The following portions of the chart were reviewed this encounter and updated as appropriate:      Review of Systems:  No other skin or systemic complaints except as noted in HPI or Assessment and Plan.  Objective  Well appearing patient in no apparent distress; mood and affect are within normal limits.  A focused examination was performed including neck, hands, wrists. Relevant physical exam findings are noted in the Assessment and Plan.  Objective  Hands, Wrists: Clear today.  Objective  neck, left flank: Light pink patches on left and right anterior neck. Follicular prominence on left flank.  Objective  Axilla, Hands, Feet: Increased moisture on palms and soles, axilla.  Objective  Left Thigh - Anterior: 1.4cm speckled brown flat papule.   Assessment & Plan  Dyshidrotic hand dermatitis Hands, Wrists  Recommend mild soap and routine use of moisturizing cream after handwashing.  Minimize soap/water exposure when possible.     Start mometasone cream qd/bid prn flares.  If not improving, patient will call for different topical.  Atopic dermatitis, unspecified type neck, left flank  Start mometasone cream Apply to AAs rash QD/BID until improved.  Aveeno Eczema Therapy, Gold Bond Eczema, CeraVe Cream  Topical steroids (such as triamcinolone, fluocinolone, fluocinonide, mometasone, clobetasol, halobetasol, betamethasone, hydrocortisone) can cause thinning and lightening of the skin if they are used for too long in the same area. Your physician has selected the right strength medicine for your problem and area affected on the body. Please use your medication only as directed by  your physician to prevent side effects.    mometasone (ELOCON) 0.1 % cream - neck, left flank  Primary focal hyperhidrosis Axilla, Hands, Feet  Sample of Dove Clinical Strength Deodorant. Patient may try carpe lotion for hands and feet.  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  Observation.  Call clinic for new or changing moles.  Recommend daily use of broad spectrum spf 30+ sunscreen to sun-exposed areas.    Return if symptoms worsen or fail to improve.  IJamesetta Orleans, CMA, am acting as scribe for Brendolyn Patty, MD .  Documentation: I have reviewed the above documentation for accuracy and completeness, and I agree with the above.  Brendolyn Patty MD

## 2020-08-10 ENCOUNTER — Other Ambulatory Visit: Payer: Self-pay

## 2020-08-10 ENCOUNTER — Other Ambulatory Visit: Payer: Medicaid Other

## 2020-08-10 DIAGNOSIS — Z20822 Contact with and (suspected) exposure to covid-19: Secondary | ICD-10-CM

## 2020-08-12 LAB — NOVEL CORONAVIRUS, NAA: SARS-CoV-2, NAA: NOT DETECTED

## 2020-08-12 LAB — SARS-COV-2, NAA 2 DAY TAT

## 2020-08-23 ENCOUNTER — Other Ambulatory Visit: Payer: Medicaid Other

## 2020-08-23 DIAGNOSIS — Z20822 Contact with and (suspected) exposure to covid-19: Secondary | ICD-10-CM

## 2020-08-25 LAB — SARS-COV-2, NAA 2 DAY TAT

## 2020-08-25 LAB — NOVEL CORONAVIRUS, NAA: SARS-CoV-2, NAA: NOT DETECTED
